# Patient Record
Sex: Male | Born: 1965 | Race: White | Hispanic: No | Smoking: Current every day smoker
Health system: Southern US, Community
[De-identification: ages and names within clinical notes are randomized; demographics above are authoritative.]

## PROBLEM LIST (undated history)

## (undated) DIAGNOSIS — K219 Gastro-esophageal reflux disease without esophagitis: Secondary | ICD-10-CM

## (undated) DIAGNOSIS — T7840XA Allergy, unspecified, initial encounter: Secondary | ICD-10-CM

## (undated) DIAGNOSIS — F419 Anxiety disorder, unspecified: Secondary | ICD-10-CM

## (undated) DIAGNOSIS — I10 Essential (primary) hypertension: Secondary | ICD-10-CM

## (undated) DIAGNOSIS — F32A Depression, unspecified: Secondary | ICD-10-CM

## (undated) DIAGNOSIS — G35 Multiple sclerosis: Secondary | ICD-10-CM

## (undated) HISTORY — DX: Gastro-esophageal reflux disease without esophagitis: K21.9

## (undated) HISTORY — PX: OTHER SURGICAL HISTORY: SHX169

## (undated) HISTORY — PX: CERVICAL FUSION: SHX112

## (undated) HISTORY — DX: Depression, unspecified: F32.A

## (undated) HISTORY — PX: APPENDECTOMY: SHX54

## (undated) HISTORY — DX: Essential (primary) hypertension: I10

## (undated) HISTORY — DX: Anxiety disorder, unspecified: F41.9

## (undated) HISTORY — DX: Multiple sclerosis: G35

## (undated) HISTORY — DX: Allergy, unspecified, initial encounter: T78.40XA

---

## 2020-03-09 ENCOUNTER — Ambulatory Visit: Payer: Medicare HMO | Admitting: Neurology

## 2020-03-18 ENCOUNTER — Ambulatory Visit: Payer: Medicare HMO | Admitting: Neurology

## 2020-03-19 ENCOUNTER — Ambulatory Visit: Payer: Medicare HMO | Admitting: Neurology

## 2020-03-19 ENCOUNTER — Encounter: Payer: Self-pay | Admitting: Neurology

## 2020-03-19 ENCOUNTER — Other Ambulatory Visit: Payer: Self-pay

## 2020-03-19 VITALS — BP 138/75 | HR 77 | Ht 69.0 in | Wt 216.0 lb

## 2020-03-19 DIAGNOSIS — R269 Unspecified abnormalities of gait and mobility: Secondary | ICD-10-CM

## 2020-03-19 DIAGNOSIS — S4992XA Unspecified injury of left shoulder and upper arm, initial encounter: Secondary | ICD-10-CM | POA: Insufficient documentation

## 2020-03-19 DIAGNOSIS — G35 Multiple sclerosis: Secondary | ICD-10-CM | POA: Diagnosis not present

## 2020-03-19 DIAGNOSIS — R252 Cramp and spasm: Secondary | ICD-10-CM

## 2020-03-19 DIAGNOSIS — S4992XS Unspecified injury of left shoulder and upper arm, sequela: Secondary | ICD-10-CM

## 2020-03-19 MED ORDER — CYCLOBENZAPRINE HCL 10 MG PO TABS: 10.0000 mg | ORAL_TABLET | Freq: Three times a day (TID) | ORAL | 5 refills | Status: DC | PRN

## 2020-03-19 MED ORDER — RISPERIDONE 0.25 MG PO TABS: 0.5000 mg | ORAL_TABLET | Freq: Every day | ORAL | 11 refills | Status: DC

## 2020-03-19 MED ORDER — GABAPENTIN 400 MG PO CAPS: ORAL_CAPSULE | ORAL | 11 refills | Status: DC
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
## 2020-03-24 ENCOUNTER — Telehealth: Payer: Self-pay | Admitting: *Deleted

## 2020-03-24 NOTE — Telephone Encounter (Signed)
Request faxed to [facility] [phone].

## 2020-04-04 ENCOUNTER — Telehealth: Payer: Self-pay | Admitting: Neurology

## 2020-04-04 NOTE — Telephone Encounter (Signed)
We received notes from [facility].  MRI images are not yet available.  According to his neurologist, MRI scans over the years have not demonstrated any new lesions prompting the decision not to use a disease modifying therapy.  His second to last evaluatio was 05/24/2019.  Repeat MRI scans of the brain cervical and thoracic spine were unchanged compared to the MRI from 2017, by report.    There is no explanation on the recent MRIs to explain his left arm pain, muscle spasms and tremors that worsened in 2019.  EMG/NCV done in February 2021 was normal.  He saw Dr.Eleftheriou in 2020 and 2021. He previously had seen Dr. Melanee Left  MRI prior Drive reports were reviewed (images not available have been requested).   MRI of the brain 03/05/2019 showed an unchanged appearance of multiple foci of T2/flair hyperintensity compared to the 2017 MRI compatible with the patient's history of multiple sclerosis.  No active plaque or significant atrophy.  No new lesions were identified.    MRI of the cervical spine 03/05/2019 showed a normal appearance of the cervical spinal cord and mild stable degenerative changes that do not cause foraminal or canal narrowing  MRI of the thoracic spine 03/05/2019 showed no evidence of demyelinating disease affecting the thoracic spinal cord and no significant degenerative change

## 2020-06-29 DIAGNOSIS — I10 Essential (primary) hypertension: Secondary | ICD-10-CM | POA: Diagnosis not present

## 2020-06-29 DIAGNOSIS — G35 Multiple sclerosis: Secondary | ICD-10-CM | POA: Diagnosis not present

## 2020-06-29 DIAGNOSIS — Z1211 Encounter for screening for malignant neoplasm of colon: Secondary | ICD-10-CM | POA: Diagnosis not present

## 2020-06-30 DIAGNOSIS — F4312 Post-traumatic stress disorder, chronic: Secondary | ICD-10-CM | POA: Diagnosis not present

## 2020-06-30 DIAGNOSIS — F331 Major depressive disorder, recurrent, moderate: Secondary | ICD-10-CM | POA: Diagnosis not present

## 2020-06-30 DIAGNOSIS — F411 Generalized anxiety disorder: Secondary | ICD-10-CM | POA: Diagnosis not present

## 2020-07-14 DIAGNOSIS — Z1211 Encounter for screening for malignant neoplasm of colon: Secondary | ICD-10-CM | POA: Diagnosis not present

## 2020-07-14 DIAGNOSIS — K635 Polyp of colon: Secondary | ICD-10-CM | POA: Diagnosis not present

## 2020-07-14 DIAGNOSIS — K6389 Other specified diseases of intestine: Secondary | ICD-10-CM | POA: Diagnosis not present

## 2020-07-17 DIAGNOSIS — F329 Major depressive disorder, single episode, unspecified: Secondary | ICD-10-CM | POA: Diagnosis not present

## 2020-07-17 DIAGNOSIS — Z888 Allergy status to other drugs, medicaments and biological substances status: Secondary | ICD-10-CM | POA: Diagnosis not present

## 2020-07-17 DIAGNOSIS — F411 Generalized anxiety disorder: Secondary | ICD-10-CM | POA: Diagnosis not present

## 2020-07-17 DIAGNOSIS — G8929 Other chronic pain: Secondary | ICD-10-CM | POA: Diagnosis not present

## 2020-07-17 DIAGNOSIS — G629 Polyneuropathy, unspecified: Secondary | ICD-10-CM | POA: Diagnosis not present

## 2020-07-17 DIAGNOSIS — E669 Obesity, unspecified: Secondary | ICD-10-CM | POA: Diagnosis not present

## 2020-07-17 DIAGNOSIS — G35 Multiple sclerosis: Secondary | ICD-10-CM | POA: Diagnosis not present

## 2020-07-17 DIAGNOSIS — G8220 Paraplegia, unspecified: Secondary | ICD-10-CM | POA: Diagnosis not present

## 2020-07-17 DIAGNOSIS — F4310 Post-traumatic stress disorder, unspecified: Secondary | ICD-10-CM | POA: Diagnosis not present

## 2020-07-17 DIAGNOSIS — F17210 Nicotine dependence, cigarettes, uncomplicated: Secondary | ICD-10-CM | POA: Diagnosis not present

## 2020-07-17 DIAGNOSIS — Z8249 Family history of ischemic heart disease and other diseases of the circulatory system: Secondary | ICD-10-CM | POA: Diagnosis not present

## 2020-07-17 DIAGNOSIS — N529 Male erectile dysfunction, unspecified: Secondary | ICD-10-CM | POA: Diagnosis not present

## 2020-07-23 ENCOUNTER — Ambulatory Visit: Payer: Medicare HMO | Admitting: Neurology

## 2020-08-03 ENCOUNTER — Ambulatory Visit: Payer: Medicare HMO | Admitting: Neurology

## 2020-08-03 ENCOUNTER — Encounter: Payer: Self-pay | Admitting: Neurology

## 2020-08-03 VITALS — BP 148/91 | HR 65 | Ht 69.0 in | Wt 210.0 lb

## 2020-08-03 DIAGNOSIS — Z79899 Other long term (current) drug therapy: Secondary | ICD-10-CM | POA: Diagnosis not present

## 2020-08-03 DIAGNOSIS — G35 Multiple sclerosis: Secondary | ICD-10-CM

## 2020-08-03 DIAGNOSIS — R252 Cramp and spasm: Secondary | ICD-10-CM

## 2020-08-03 DIAGNOSIS — R269 Unspecified abnormalities of gait and mobility: Secondary | ICD-10-CM
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
## 2020-08-05 LAB — COMPREHENSIVE METABOLIC PANEL
ALT: 19 IU/L (ref 0–44)
AST: 11 IU/L (ref 0–40)
Albumin/Globulin Ratio: 2.3 — ABNORMAL HIGH (ref 1.2–2.2)
Albumin: 5.1 g/dL — ABNORMAL HIGH (ref 3.8–4.9)
Alkaline Phosphatase: 79 IU/L (ref 44–121)
BUN/Creatinine Ratio: 17 (ref 9–20)
BUN: 16 mg/dL (ref 6–24)
Bilirubin Total: 0.4 mg/dL (ref 0.0–1.2)
CO2: 25 mmol/L (ref 20–29)
Calcium: 9.6 mg/dL (ref 8.7–10.2)
Chloride: 103 mmol/L (ref 96–106)
Creatinine, Ser: 0.95 mg/dL (ref 0.76–1.27)
Globulin, Total: 2.2 g/dL (ref 1.5–4.5)
Glucose: 93 mg/dL (ref 65–99)
Potassium: 4.5 mmol/L (ref 3.5–5.2)
Sodium: 140 mmol/L (ref 134–144)
Total Protein: 7.3 g/dL (ref 6.0–8.5)
eGFR: 95 mL/min/{1.73_m2} (ref >59)

## 2020-08-05 LAB — QUANTIFERON-TB GOLD PLUS
QuantiFERON Mitogen Value: >10 IU/mL
QuantiFERON Nil Value: 0 IU/mL
QuantiFERON TB1 Ag Value: 0.06 IU/mL
QuantiFERON TB2 Ag Value: 0 IU/mL
QuantiFERON-TB Gold Plus: NEGATIVE

## 2020-08-05 LAB — CBC WITH DIFFERENTIAL/PLATELET
Basophils Absolute: 0.1 10*3/uL (ref 0.0–0.2)
Basos: 1 %
EOS (ABSOLUTE): 0.2 10*3/uL (ref 0.0–0.4)
Eos: 2 %
Hematocrit: 50.1 % (ref 37.5–51.0)
Hemoglobin: 17 g/dL (ref 13.0–17.7)
Immature Grans (Abs): 0 10*3/uL (ref 0.0–0.1)
Immature Granulocytes: 0 %
Lymphocytes Absolute: 2.3 10*3/uL (ref 0.7–3.1)
Lymphs: 27 %
MCH: 31.2 pg (ref 26.6–33.0)
MCHC: 33.9 g/dL (ref 31.5–35.7)
MCV: 92 fL (ref 79–97)
Monocytes Absolute: 0.8 10*3/uL (ref 0.1–0.9)
Monocytes: 9 %
Neutrophils Absolute: 5.2 10*3/uL (ref 1.4–7.0)
Neutrophils: 61 %
Platelets: 264 10*3/uL (ref 150–450)
RBC: 5.45 x10E6/uL (ref 4.14–5.80)
RDW: 11.8 % (ref 11.6–15.4)
WBC: 8.5 10*3/uL (ref 3.4–10.8)

## 2020-08-10 ENCOUNTER — Telehealth: Payer: Self-pay | Admitting: *Deleted

## 2020-08-10 NOTE — Telephone Encounter (Signed)
R/c cd [employer] cd on [employer].

## 2020-08-17 ENCOUNTER — Telehealth: Payer: Self-pay | Admitting: Neurology

## 2020-08-17 NOTE — Telephone Encounter (Signed)
Called, got VM and left message   I compared the MRIs from 03/05/2019 with MRIs from 04/10/2015 and 04/09/2014.  The MRI of the brain shows an overall very low plaque burden with some periventricular and deep white matter foci in the hemispheres but not in the infratentorial white matter.  The MRI of the thoracic spine from January 2021 showed several foci at T10, T11 and T11-T12 and possibly another focus at T4.    The MRI of the brain shows mild progression between 2017 and 2021 with 1-2 more foci.  The foci at T11 and T12 seen in 2021 were not clearly seen on thoracic MRI from 2016 or 2017.  There were no cervical spine plaques.  He has ACDF from C5-C7.

## 2020-08-18 DIAGNOSIS — E6609 Other obesity due to excess calories: Secondary | ICD-10-CM | POA: Diagnosis not present

## 2020-08-18 DIAGNOSIS — D751 Secondary polycythemia: Secondary | ICD-10-CM | POA: Diagnosis not present

## 2020-08-18 DIAGNOSIS — M25512 Pain in left shoulder: Secondary | ICD-10-CM | POA: Diagnosis not present

## 2020-08-18 DIAGNOSIS — I10 Essential (primary) hypertension: Secondary | ICD-10-CM | POA: Diagnosis not present

## 2020-08-18 DIAGNOSIS — Z720 Tobacco use: Secondary | ICD-10-CM | POA: Diagnosis not present

## 2020-08-18 DIAGNOSIS — G8929 Other chronic pain: Secondary | ICD-10-CM | POA: Diagnosis not present

## 2020-08-18 DIAGNOSIS — Z0001 Encounter for general adult medical examination with abnormal findings: Secondary | ICD-10-CM | POA: Diagnosis not present

## 2020-08-20 DIAGNOSIS — H5203 Hypermetropia, bilateral: Secondary | ICD-10-CM | POA: Diagnosis not present

## 2020-08-24 DIAGNOSIS — M25512 Pain in left shoulder: Secondary | ICD-10-CM | POA: Diagnosis not present

## 2020-08-25 DIAGNOSIS — F331 Major depressive disorder, recurrent, moderate: Secondary | ICD-10-CM | POA: Diagnosis not present

## 2020-08-25 DIAGNOSIS — F411 Generalized anxiety disorder: Secondary | ICD-10-CM | POA: Diagnosis not present

## 2020-08-25 DIAGNOSIS — F4312 Post-traumatic stress disorder, chronic: Secondary | ICD-10-CM | POA: Diagnosis not present

## 2020-09-04 DIAGNOSIS — M25512 Pain in left shoulder: Secondary | ICD-10-CM | POA: Diagnosis not present

## 2020-09-07 NOTE — Telephone Encounter (Signed)
Weeks Mr. Touhyabout the MRI results and comparison.  I would like to get him started on Aubagio.  I think he signed a service request form.  If not we will need to have him do so.  He would need monthly liver function tests for 6 months once he gets started

## 2020-09-08 NOTE — Telephone Encounter (Signed)
Start form for review is at the front desk for patient.

## 2020-09-08 NOTE — Telephone Encounter (Signed)
I called patient.  He needs to sign and Aubagio start form.  He is agreeable to this.  He will be coming by tomorrow afternoon to sign the Aubagio start form.  I discussed the process including insurance authorization and communication with [facility] and the specialty pharmacy.  I reminded him that he will need monthly liver function test for 6 months after starting Aubagio.  Patient is agreeable to all of this.

## 2020-09-09 DIAGNOSIS — M25512 Pain in left shoulder: Secondary | ICD-10-CM | POA: Diagnosis not present

## 2020-09-14 NOTE — Telephone Encounter (Signed)
PA for aubagio was denied. "The drug you asked for is non-formulary (not on [employer] list of preferred drugs). Before the drug can be covered, we need more information. Please ask your prescriber to explain to [facility] why the preferred drugs have not worked for your medical condition and/or would have bad side effects. If you have not tried the preferred drugs, including Betaseron subcutaneous kit, Copaxone subcutaneous syringe, dimethyl fumarate capsule delayed release, Gilenya capsule, glatiramer subcutaneous syringe, Glatopa subcutaneous syringe, and Kesimpta Pen subcutaneous pen injector, please talk to your health care provider about prescribing one of these for you. Some preferred drugs may require an additional review and approval by [facility]. Additionally, some alternative drugs listed may be the same drugs with different strengths or forms. Under certain cases, [employer] may only require trial and failure of one strength or form of that drug. This determination was based on the [employer] Non-Formulary Exceptions Coverage Policy."

## 2020-09-14 NOTE — Telephone Encounter (Signed)
Dr. Epimenio Foot signed the Aubagio start form.  I have faxed it to [employer].  PA initiated on CMM. Sent to [facility].Key: [id number]. Should have determination within 1-3 business days.

## 2020-09-15 NOTE — Telephone Encounter (Signed)
Discussed with Dr. Epimenio Foot. Appeal letter written and sent to [facility] via CMM.

## 2020-09-16 ENCOUNTER — Telehealth: Payer: Self-pay | Admitting: Neurology

## 2020-09-16 NOTE — Telephone Encounter (Signed)
Pt called stating he got a phone call from [facility] pharmacy stating that a prescription for his MS was denied. Pt did not know the name of the medication. He states he came in on last Thursday and signed a form. Pt requesting a call back.

## 2020-09-16 NOTE — Telephone Encounter (Signed)
Called the patient back.  Advised the patient that we have been notified of the denial.  Informed the patient that our team is working on the appeal letter for him to get that medication.  Advised it can take up to 30 days before receiving a response on the appeal letter.  Advised once we have that information we will let him know.  Patient verbalized understanding.

## 2020-09-21 NOTE — Telephone Encounter (Signed)
I called [employer] to check the status of the appeal. [employer]'s system is down and I was asked to call back later.

## 2020-09-21 NOTE — Telephone Encounter (Signed)
I called [employer].  They have not received the appeal.  They will fax me the PA denial letter so that I can fax the urgent appeal letter for Aubagio.  Received denial letter for Aubagio.  Faxed urgent appeal to [facility].  Received a receipt of confirmation.  Turnaround time should be 72 hours or less.

## 2020-09-24 ENCOUNTER — Other Ambulatory Visit: Payer: Self-pay | Admitting: Neurology

## 2020-09-28 NOTE — Telephone Encounter (Signed)
Angelique is out of the office.  I called [employer].  I spoke with Toniann Fail, RN.  I advised her that the Aubagio appeal was approved.  She reports that they have been unable to reach patient.  He has not returned their phone calls.  Patient should call [phone] to schedule his delivery of Aubagio.  I called patient to discuss.  No answer, left a voicemail asking him to call me back.  If patient calls back please advise him that he will need to call the above phone number to schedule his delivery of Aubagio.

## 2020-09-28 NOTE — Telephone Encounter (Signed)
Pt called, informed patient of previous telephone note to call phone number to schedule delivery of Aubagio. Pt verbalized understand.

## 2020-09-28 NOTE — Telephone Encounter (Signed)
I called [employer] appeals department.  The appeal for Seth Lindsey was approved from September 22, 2020 to February 27, 2021.  It is good for quantity of 30 for 30 days.  The case ID number is [id number].  I have informed Angelique at [employer] services.

## 2020-10-05 DIAGNOSIS — F411 Generalized anxiety disorder: Secondary | ICD-10-CM | POA: Diagnosis not present

## 2020-10-05 DIAGNOSIS — F4312 Post-traumatic stress disorder, chronic: Secondary | ICD-10-CM | POA: Diagnosis not present

## 2020-10-05 DIAGNOSIS — F331 Major depressive disorder, recurrent, moderate: Secondary | ICD-10-CM | POA: Diagnosis not present

## 2020-10-12 NOTE — Telephone Encounter (Signed)
I called patient.  He received his Aubagio shipment.  He plans on starting it tomorrow.  I will check with him next week to make sure he tolerated well.  We also need to schedule his once monthly lab draws for the next 6 months.  Patient verbalized understanding.

## 2020-10-16 ENCOUNTER — Ambulatory Visit
Admission: RE | Admit: 2020-10-16 | Discharge: 2020-10-16 | Disposition: A | Discharge status: Home / Self Care | Payer: Medicare HMO | Source: Ambulatory Visit | Attending: Orthopaedic Surgery | Admitting: Orthopaedic Surgery

## 2020-10-16 ENCOUNTER — Other Ambulatory Visit: Payer: Self-pay | Admitting: Orthopaedic Surgery

## 2020-10-16 DIAGNOSIS — M19012 Primary osteoarthritis, left shoulder: Secondary | ICD-10-CM | POA: Diagnosis not present

## 2020-10-16 DIAGNOSIS — Z01818 Encounter for other preprocedural examination: Secondary | ICD-10-CM | POA: Diagnosis not present

## 2020-10-16 DIAGNOSIS — M25512 Pain in left shoulder: Secondary | ICD-10-CM

## 2020-10-19 ENCOUNTER — Other Ambulatory Visit: Payer: Self-pay

## 2020-10-19 DIAGNOSIS — Z79899 Other long term (current) drug therapy: Secondary | ICD-10-CM

## 2020-10-19 DIAGNOSIS — G35 Multiple sclerosis: Secondary | ICD-10-CM

## 2020-10-19 NOTE — Telephone Encounter (Signed)
Patient returned my call.  He reports that he has not started Aubagio yet.  He will be traveling to [place] in the coming days for family emergency.  He will start Aubagio before he leaves.  He will let us know if he has any side effects.  He considered delaying starting Aubagio until he returns.  I advised him that there is always a risk of further progression of MS the longer he is off a DMT. Patient decided that he will start before his trip.  Patient will come by late September for his first LFTs.  Patient had no further questions or concerns at this time.

## 2020-10-19 NOTE — Telephone Encounter (Signed)
I called patient to discuss. No answer, left a voicemail asking him to call me back. 

## 2020-11-09 ENCOUNTER — Other Ambulatory Visit: Payer: Self-pay

## 2020-11-09 ENCOUNTER — Encounter (HOSPITAL_BASED_OUTPATIENT_CLINIC_OR_DEPARTMENT_OTHER): Payer: Self-pay | Admitting: Orthopaedic Surgery

## 2020-11-11 ENCOUNTER — Encounter (HOSPITAL_BASED_OUTPATIENT_CLINIC_OR_DEPARTMENT_OTHER)
Admission: RE | Admit: 2020-11-11 | Discharge: 2020-11-11 | Disposition: A | Discharge status: Home / Self Care | Payer: Medicare HMO | Source: Ambulatory Visit | Attending: Orthopaedic Surgery | Admitting: Orthopaedic Surgery

## 2020-11-11 DIAGNOSIS — Z01810 Encounter for preprocedural cardiovascular examination: Secondary | ICD-10-CM | POA: Diagnosis not present

## 2020-11-11 NOTE — Progress Notes (Signed)
Reviewed EKG with Dr. Hodierne. Okay to proceed with surgery as scheduled  

## 2020-11-11 NOTE — Progress Notes (Addendum)
Gave patient BPO and instructions     Enhanced Recovery after Surgery for Orthopedics Enhanced Recovery after Surgery is a protocol used to improve the stress on your body and your recovery after surgery.  Patient Instructions  The night before surgery:  No food after midnight. ONLY clear liquids after midnight  The day of surgery (if you do NOT have diabetes):  Drink ONE (1) Pre-Surgery Clear Ensure as directed.   This drink was given to you during your hospital  pre-op appointment visit. The pre-op nurse will instruct you on the time to drink the  Pre-Surgery Ensure depending on your surgery time. Finish the drink at the designated time by the pre-op nurse.  Nothing else to drink after completing the  Pre-Surgery Clear Ensure.  The day of surgery (if you have diabetes): Drink ONE (1) Gatorade 2 (G2) as directed. This drink was given to you during your hospital  pre-op appointment visit.  The pre-op nurse will instruct you on the time to drink the   Gatorade 2 (G2) depending on your surgery time. Color of the Gatorade may vary. Red is not allowed. Nothing else to drink after completing the  Gatorade 2 (G2).         If you have questions, please contact your surgeon's office.

## 2020-11-13 DIAGNOSIS — D751 Secondary polycythemia: Secondary | ICD-10-CM | POA: Diagnosis not present

## 2020-11-13 DIAGNOSIS — G8929 Other chronic pain: Secondary | ICD-10-CM | POA: Diagnosis not present

## 2020-11-13 DIAGNOSIS — I10 Essential (primary) hypertension: Secondary | ICD-10-CM | POA: Diagnosis not present

## 2020-11-13 DIAGNOSIS — Z720 Tobacco use: Secondary | ICD-10-CM | POA: Diagnosis not present

## 2020-11-13 DIAGNOSIS — Z01818 Encounter for other preprocedural examination: Secondary | ICD-10-CM | POA: Diagnosis not present

## 2020-11-13 DIAGNOSIS — Z131 Encounter for screening for diabetes mellitus: Secondary | ICD-10-CM | POA: Diagnosis not present

## 2020-11-13 DIAGNOSIS — M25512 Pain in left shoulder: Secondary | ICD-10-CM | POA: Diagnosis not present

## 2020-11-13 DIAGNOSIS — R739 Hyperglycemia, unspecified: Secondary | ICD-10-CM | POA: Diagnosis not present

## 2020-11-13 DIAGNOSIS — E6609 Other obesity due to excess calories: Secondary | ICD-10-CM | POA: Diagnosis not present
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
## 2020-11-17 NOTE — Anesthesia Preprocedure Evaluation (Addendum)
Anesthesia Evaluation  Patient identified by MRN, date of birth, ID band Patient awake    Reviewed: Allergy & Precautions, H&P , NPO status , Patient's Chart, lab work & pertinent test results  Airway Mallampati: II  TM Distance: >3 FB Neck ROM: Full    Dental no notable dental hx. (+) Teeth Intact, Dental Advisory Given   Pulmonary neg pulmonary ROS, Current Smoker and Patient abstained from smoking.,    Pulmonary exam normal breath sounds clear to auscultation       Cardiovascular Exercise Tolerance: Good hypertension, Pt. on medications negative cardio ROS Normal cardiovascular exam Rhythm:Regular Rate:Normal     Neuro/Psych Multiple sclerosis   Neuromuscular disease negative neurological ROS  negative psych ROS   GI/Hepatic negative GI ROS, Neg liver ROS,   Endo/Other  negative endocrine ROS  Renal/GU negative Renal ROS  negative genitourinary   Musculoskeletal negative musculoskeletal ROS (+)   Abdominal   Peds negative pediatric ROS (+)  Hematology negative hematology ROS (+)   Anesthesia Other Findings MS with some weakness in LE.  Motor in UE is normal with some hand paraesthesia.   Reproductive/Obstetrics negative OB ROS                            Anesthesia Physical Anesthesia Plan  ASA: 3  Anesthesia Plan: General   Post-op Pain Management: GA combined w/ Regional for post-op pain   Induction: Intravenous  PONV Risk Score and Plan: 2 and Ondansetron and Dexamethasone  Airway Management Planned: Oral ETT and LMA  Additional Equipment: None  Intra-op Plan:   Post-operative Plan: Extubation in OR  Informed Consent: I have reviewed the patients History and Physical, chart, labs and discussed the procedure including the risks, benefits and alternatives for the proposed anesthesia with the patient or authorized representative who has indicated his/her understanding and  acceptance.     Dental advisory given  Plan Discussed with: Anesthesiologist and CRNA  Anesthesia Plan Comments: (Discussed both nerve block for pain relief post-op and GA; including NV, sore throat, dental injury, and pulmonary complications)       Anesthesia Quick Evaluation

## 2020-11-18 ENCOUNTER — Encounter (HOSPITAL_BASED_OUTPATIENT_CLINIC_OR_DEPARTMENT_OTHER)
Admission: RE | Disposition: A | Discharge status: Home / Self Care | Payer: Self-pay | Source: Home / Self Care | Attending: Orthopaedic Surgery

## 2020-11-18 ENCOUNTER — Other Ambulatory Visit: Payer: Self-pay

## 2020-11-18 ENCOUNTER — Ambulatory Visit (HOSPITAL_BASED_OUTPATIENT_CLINIC_OR_DEPARTMENT_OTHER)
Admission: RE | Admit: 2020-11-18 | Discharge: 2020-11-18 | Disposition: A | Discharge status: Home / Self Care | Payer: Medicare HMO | Attending: Orthopaedic Surgery | Admitting: Orthopaedic Surgery

## 2020-11-18 ENCOUNTER — Encounter (HOSPITAL_BASED_OUTPATIENT_CLINIC_OR_DEPARTMENT_OTHER): Payer: Self-pay | Admitting: Orthopaedic Surgery

## 2020-11-18 ENCOUNTER — Ambulatory Visit (HOSPITAL_BASED_OUTPATIENT_CLINIC_OR_DEPARTMENT_OTHER): Payer: Medicare HMO | Admitting: Anesthesiology

## 2020-11-18 ENCOUNTER — Ambulatory Visit (HOSPITAL_COMMUNITY): Payer: Medicare HMO

## 2020-11-18 DIAGNOSIS — S42292P Other displaced fracture of upper end of left humerus, subsequent encounter for fracture with malunion: Secondary | ICD-10-CM | POA: Diagnosis not present

## 2020-11-18 DIAGNOSIS — G35 Multiple sclerosis: Secondary | ICD-10-CM | POA: Diagnosis not present

## 2020-11-18 DIAGNOSIS — Z471 Aftercare following joint replacement surgery: Secondary | ICD-10-CM | POA: Diagnosis not present

## 2020-11-18 DIAGNOSIS — F17210 Nicotine dependence, cigarettes, uncomplicated: Secondary | ICD-10-CM | POA: Diagnosis not present

## 2020-11-18 DIAGNOSIS — I10 Essential (primary) hypertension: Secondary | ICD-10-CM | POA: Insufficient documentation

## 2020-11-18 DIAGNOSIS — M7522 Bicipital tendinitis, left shoulder: Secondary | ICD-10-CM | POA: Diagnosis not present

## 2020-11-18 DIAGNOSIS — Z09 Encounter for follow-up examination after completed treatment for conditions other than malignant neoplasm: Secondary | ICD-10-CM

## 2020-11-18 DIAGNOSIS — X58XXXD Exposure to other specified factors, subsequent encounter: Secondary | ICD-10-CM | POA: Insufficient documentation

## 2020-11-18 DIAGNOSIS — M19012 Primary osteoarthritis, left shoulder: Secondary | ICD-10-CM | POA: Diagnosis not present

## 2020-11-18 DIAGNOSIS — Z887 Allergy status to serum and vaccine status: Secondary | ICD-10-CM | POA: Insufficient documentation

## 2020-11-18 DIAGNOSIS — Z79899 Other long term (current) drug therapy: Secondary | ICD-10-CM | POA: Insufficient documentation

## 2020-11-18 DIAGNOSIS — Z9889 Other specified postprocedural states: Secondary | ICD-10-CM | POA: Diagnosis not present

## 2020-11-18 DIAGNOSIS — S42302P Unspecified fracture of shaft of humerus, left arm, subsequent encounter for fracture with malunion: Secondary | ICD-10-CM | POA: Diagnosis not present

## 2020-11-18 DIAGNOSIS — G8918 Other acute postprocedural pain: Secondary | ICD-10-CM | POA: Diagnosis not present

## 2020-11-18 DIAGNOSIS — Z96612 Presence of left artificial shoulder joint: Secondary | ICD-10-CM | POA: Diagnosis not present

## 2020-11-18 DIAGNOSIS — Z888 Allergy status to other drugs, medicaments and biological substances status: Secondary | ICD-10-CM | POA: Diagnosis not present

## 2020-11-18 HISTORY — PX: REVERSE SHOULDER ARTHROPLASTY: SHX5054

## 2020-11-18 SURGERY — ARTHROPLASTY, SHOULDER, TOTAL, REVERSE
Anesthesia: General | Site: Shoulder | Laterality: Left

## 2020-11-18 MED ORDER — CELECOXIB 100 MG PO CAPS: 100.0000 mg | ORAL_CAPSULE | Freq: Two times a day (BID) | ORAL | 0 refills | Status: AC

## 2020-11-18 MED ORDER — ASPIRIN 81 MG PO CHEW: 81.0000 mg | CHEWABLE_TABLET | Freq: Two times a day (BID) | ORAL | 0 refills | Status: AC

## 2020-11-18 MED ORDER — ROCURONIUM BROMIDE 10 MG/ML (PF) SYRINGE
PREFILLED_SYRINGE | INTRAVENOUS | Status: AC
  Filled 2020-11-18: qty 10

## 2020-11-18 MED ORDER — MIDAZOLAM HCL 2 MG/2ML IJ SOLN
INTRAMUSCULAR | Status: AC
  Filled 2020-11-18: qty 2

## 2020-11-18 MED ORDER — FENTANYL CITRATE (PF) 100 MCG/2ML IJ SOLN
INTRAMUSCULAR | Status: AC
  Filled 2020-11-18: qty 2

## 2020-11-18 MED ORDER — GABAPENTIN 300 MG PO CAPS
ORAL_CAPSULE | ORAL | Status: AC
  Filled 2020-11-18: qty 1

## 2020-11-18 MED ORDER — PHENYLEPHRINE HCL (PRESSORS) 10 MG/ML IV SOLN
INTRAVENOUS | Status: DC | PRN
  Administered 2020-11-18: 40 ug via INTRAVENOUS
  Administered 2020-11-18: 120 ug via INTRAVENOUS
  Administered 2020-11-18: 80 ug via INTRAVENOUS
  Administered 2020-11-18: 40 ug via INTRAVENOUS
  Administered 2020-11-18: 80 ug via INTRAVENOUS
  Administered 2020-11-18: 40 ug via INTRAVENOUS

## 2020-11-18 MED ORDER — FENTANYL CITRATE (PF) 100 MCG/2ML IJ SOLN
INTRAMUSCULAR | Status: DC | PRN
  Administered 2020-11-18: 100 ug via INTRAVENOUS

## 2020-11-18 MED ORDER — VANCOMYCIN HCL 1000 MG IV SOLR
INTRAVENOUS | Status: DC | PRN
  Administered 2020-11-18: 1000 mg

## 2020-11-18 MED ORDER — CEFAZOLIN SODIUM-DEXTROSE 2-4 GM/100ML-% IV SOLN
INTRAVENOUS | Status: AC
  Filled 2020-11-18: qty 100

## 2020-11-18 MED ORDER — SUGAMMADEX SODIUM 200 MG/2ML IV SOLN
INTRAVENOUS | Status: DC | PRN
  Administered 2020-11-18: 200 mg via INTRAVENOUS

## 2020-11-18 MED ORDER — ACETAMINOPHEN 500 MG PO TABS
1000.0000 mg | ORAL_TABLET | Freq: Once | ORAL | Status: AC
  Administered 2020-11-18: 1000 mg via ORAL

## 2020-11-18 MED ORDER — FENTANYL CITRATE (PF) 100 MCG/2ML IJ SOLN: 25.0000 ug | INTRAMUSCULAR | Status: DC | PRN

## 2020-11-18 MED ORDER — VASOPRESSIN 20 UNIT/ML IV SOLN
INTRAVENOUS | Status: DC | PRN
  Administered 2020-11-18: 1 [IU] via INTRAVENOUS
  Administered 2020-11-18: 2 [IU] via INTRAVENOUS
  Administered 2020-11-18 (×3): 1 [IU] via INTRAVENOUS
  Administered 2020-11-18: 2 [IU] via INTRAVENOUS

## 2020-11-18 MED ORDER — ONDANSETRON HCL 4 MG/2ML IJ SOLN
INTRAMUSCULAR | Status: DC | PRN
  Administered 2020-11-18: 4 mg via INTRAVENOUS

## 2020-11-18 MED ORDER — GABAPENTIN 600 MG PO TABS: 600.0000 mg | ORAL_TABLET | Freq: Three times a day (TID) | ORAL | 0 refills | Status: DC

## 2020-11-18 MED ORDER — ACETAMINOPHEN 160 MG/5ML PO SOLN: 325.0000 mg | ORAL | Status: DC | PRN

## 2020-11-18 MED ORDER — PHENYLEPHRINE HCL (PRESSORS) 10 MG/ML IV SOLN
INTRAVENOUS | Status: AC
  Filled 2020-11-18: qty 1

## 2020-11-18 MED ORDER — OXYCODONE HCL 5 MG/5ML PO SOLN: 5.0000 mg | Freq: Once | ORAL | Status: AC | PRN

## 2020-11-18 MED ORDER — LIDOCAINE 2% (20 MG/ML) 5 ML SYRINGE
INTRAMUSCULAR | Status: DC | PRN
  Administered 2020-11-18: 40 mg via INTRAVENOUS

## 2020-11-18 MED ORDER — FENTANYL CITRATE (PF) 100 MCG/2ML IJ SOLN
100.0000 ug | Freq: Once | INTRAMUSCULAR | Status: AC
  Administered 2020-11-18: 100 ug via INTRAVENOUS

## 2020-11-18 MED ORDER — TRANEXAMIC ACID-NACL 1000-0.7 MG/100ML-% IV SOLN
INTRAVENOUS | Status: AC
  Filled 2020-11-18: qty 100

## 2020-11-18 MED ORDER — TRANEXAMIC ACID-NACL 1000-0.7 MG/100ML-% IV SOLN
1000.0000 mg | INTRAVENOUS | Status: AC
  Administered 2020-11-18: 1000 mg via INTRAVENOUS

## 2020-11-18 MED ORDER — ACETAMINOPHEN 325 MG PO TABS: 325.0000 mg | ORAL_TABLET | ORAL | Status: DC | PRN

## 2020-11-18 MED ORDER — OMEPRAZOLE 20 MG PO CPDR: 20.0000 mg | DELAYED_RELEASE_CAPSULE | Freq: Every day | ORAL | 0 refills | Status: DC

## 2020-11-18 MED ORDER — BUPIVACAINE LIPOSOME 1.3 % IJ SUSP
INTRAMUSCULAR | Status: DC | PRN
  Administered 2020-11-18: 10 mL via PERINEURAL

## 2020-11-18 MED ORDER — BUPIVACAINE HCL (PF) 0.25 % IJ SOLN
INTRAMUSCULAR | Status: AC
  Filled 2020-11-18: qty 30

## 2020-11-18 MED ORDER — PROPOFOL 10 MG/ML IV BOLUS
INTRAVENOUS | Status: DC | PRN
  Administered 2020-11-18: 200 mg via INTRAVENOUS

## 2020-11-18 MED ORDER — ROCURONIUM BROMIDE 100 MG/10ML IV SOLN
INTRAVENOUS | Status: DC | PRN
  Administered 2020-11-18: 60 mg via INTRAVENOUS

## 2020-11-18 MED ORDER — LACTATED RINGERS IV SOLN: INTRAVENOUS | Status: DC

## 2020-11-18 MED ORDER — OXYCODONE HCL 5 MG PO TABS: ORAL_TABLET | ORAL | 0 refills | Status: AC

## 2020-11-18 MED ORDER — PROPOFOL 10 MG/ML IV BOLUS
INTRAVENOUS | Status: AC
  Filled 2020-11-18: qty 40

## 2020-11-18 MED ORDER — ACETAMINOPHEN 500 MG PO TABS: 1000.0000 mg | ORAL_TABLET | Freq: Three times a day (TID) | ORAL | 0 refills | Status: AC

## 2020-11-18 MED ORDER — ACETAMINOPHEN 500 MG PO TABS
ORAL_TABLET | ORAL | Status: AC
  Filled 2020-11-18: qty 2

## 2020-11-18 MED ORDER — ONDANSETRON HCL 4 MG/2ML IJ SOLN: 4.0000 mg | Freq: Once | INTRAMUSCULAR | Status: DC | PRN

## 2020-11-18 MED ORDER — OXYCODONE HCL 5 MG PO TABS
5.0000 mg | ORAL_TABLET | Freq: Once | ORAL | Status: AC | PRN
  Administered 2020-11-18: 5 mg via ORAL

## 2020-11-18 MED ORDER — CEFAZOLIN SODIUM-DEXTROSE 2-4 GM/100ML-% IV SOLN
2.0000 g | INTRAVENOUS | Status: AC
  Administered 2020-11-18: 2 g via INTRAVENOUS

## 2020-11-18 MED ORDER — ATROPINE SULFATE 0.4 MG/ML IJ SOLN
INTRAMUSCULAR | Status: AC
  Filled 2020-11-18: qty 1

## 2020-11-18 MED ORDER — MEPERIDINE HCL 25 MG/ML IJ SOLN: 6.2500 mg | INTRAMUSCULAR | Status: DC | PRN

## 2020-11-18 MED ORDER — LIDOCAINE HCL (PF) 2 % IJ SOLN
INTRAMUSCULAR | Status: AC
  Filled 2020-11-18: qty 5

## 2020-11-18 MED ORDER — OXYCODONE HCL 5 MG PO TABS
ORAL_TABLET | ORAL | Status: AC
  Filled 2020-11-18: qty 1

## 2020-11-18 MED ORDER — LACTATED RINGERS IV BOLUS: 250.0000 mL | Freq: Once | INTRAVENOUS | Status: DC

## 2020-11-18 MED ORDER — ONDANSETRON HCL 4 MG/2ML IJ SOLN
INTRAMUSCULAR | Status: AC
  Filled 2020-11-18: qty 2

## 2020-11-18 MED ORDER — GABAPENTIN 300 MG PO CAPS
300.0000 mg | ORAL_CAPSULE | Freq: Once | ORAL | Status: AC
  Administered 2020-11-18: 300 mg via ORAL

## 2020-11-18 MED ORDER — ONDANSETRON HCL 4 MG PO TABS: 4.0000 mg | ORAL_TABLET | Freq: Three times a day (TID) | ORAL | 0 refills | Status: AC | PRN

## 2020-11-18 MED ORDER — EPHEDRINE SULFATE 50 MG/ML IJ SOLN
INTRAMUSCULAR | Status: DC | PRN
  Administered 2020-11-18: 5 mg via INTRAVENOUS
  Administered 2020-11-18 (×2): 10 mg via INTRAVENOUS
  Administered 2020-11-18: 5 mg via INTRAVENOUS
  Administered 2020-11-18: 10 mg via INTRAVENOUS

## 2020-11-18 MED ORDER — BUPIVACAINE-EPINEPHRINE (PF) 0.5% -1:200000 IJ SOLN
INTRAMUSCULAR | Status: DC | PRN
  Administered 2020-11-18: 15 mL via PERINEURAL

## 2020-11-18 MED ORDER — EPHEDRINE 5 MG/ML INJ
INTRAVENOUS | Status: AC
  Filled 2020-11-18: qty 10

## 2020-11-18 MED ORDER — DEXAMETHASONE SODIUM PHOSPHATE 10 MG/ML IJ SOLN
INTRAMUSCULAR | Status: AC
  Filled 2020-11-18: qty 1

## 2020-11-18 MED ORDER — DEXAMETHASONE SODIUM PHOSPHATE 10 MG/ML IJ SOLN
INTRAMUSCULAR | Status: DC | PRN
  Administered 2020-11-18: 5 mg via INTRAVENOUS

## 2020-11-18 MED ORDER — LACTATED RINGERS IV BOLUS
500.0000 mL | Freq: Once | INTRAVENOUS | Status: AC
  Administered 2020-11-18: 500 mL via INTRAVENOUS

## 2020-11-18 MED ORDER — VANCOMYCIN HCL 1000 MG IV SOLR
INTRAVENOUS | Status: AC
  Filled 2020-11-18: qty 60

## 2020-11-18 MED ORDER — MIDAZOLAM HCL 2 MG/2ML IJ SOLN
2.0000 mg | Freq: Once | INTRAMUSCULAR | Status: AC
  Administered 2020-11-18: 2 mg via INTRAVENOUS

## 2020-11-18 SURGICAL SUPPLY — 70 items
AID PSTN UNV HD RSTRNT DISP (MISCELLANEOUS) ×1
APL PRP STRL LF DISP 70% ISPRP (MISCELLANEOUS) ×1
BASEPLATE GLENOID STD REV 42 (Joint) ×2 IMPLANT
BASEPLATE GLENOSPHERE 25 STD (Miscellaneous) ×2 IMPLANT
BIT DRILL 3.2 PERIPHERAL SCREW (BIT) ×2 IMPLANT
BLADE HEX COATED 2.75 (ELECTRODE) IMPLANT
BLADE SAW SAG 73X25 THK (BLADE) ×1
BLADE SAW SGTL 73X25 THK (BLADE) ×1 IMPLANT
BLADE SURG 10 STRL SS (BLADE) IMPLANT
BLADE SURG 15 STRL LF DISP TIS (BLADE) IMPLANT
BLADE SURG 15 STRL SS (BLADE)
BNDG COHESIVE 4X5 TAN ST LF (GAUZE/BANDAGES/DRESSINGS) IMPLANT
BRUSH SCRUB EZ PLAIN DRY (MISCELLANEOUS) IMPLANT
BSPLAT GLND STD 25 RVRS SHLDR (Miscellaneous) ×1 IMPLANT
CHLORAPREP W/TINT 26 (MISCELLANEOUS) ×2 IMPLANT
CLSR STERI-STRIP ANTIMIC 1/2X4 (GAUZE/BANDAGES/DRESSINGS) ×2 IMPLANT
COOLER ICEMAN CLASSIC (MISCELLANEOUS) ×2 IMPLANT
COVER BACK TABLE 60X90IN (DRAPES) ×2 IMPLANT
COVER MAYO STAND STRL (DRAPES) ×2 IMPLANT
DECANTER SPIKE VIAL GLASS SM (MISCELLANEOUS) IMPLANT
DRAPE IMP U-DRAPE 54X76 (DRAPES) IMPLANT
DRAPE INCISE IOBAN 66X45 STRL (DRAPES) ×2 IMPLANT
DRAPE U-SHAPE 76X120 STRL (DRAPES) ×4 IMPLANT
DRSG AQUACEL AG ADV 3.5X 6 (GAUZE/BANDAGES/DRESSINGS) ×2 IMPLANT
ELECT BLADE 4.0 EZ CLEAN MEGAD (MISCELLANEOUS) ×2
ELECT REM PT RETURN 9FT ADLT (ELECTROSURGICAL) ×2
ELECTRODE BLDE 4.0 EZ CLN MEGD (MISCELLANEOUS) ×1 IMPLANT
ELECTRODE REM PT RTRN 9FT ADLT (ELECTROSURGICAL) ×1 IMPLANT
FACESHIELD WRAPAROUND (MASK) ×2 IMPLANT
GLOVE SRG 8 PF TXTR STRL LF DI (GLOVE) ×1 IMPLANT
GLOVE SURG ENC MOIS LTX SZ6.5 (GLOVE) ×6 IMPLANT
GLOVE SURG LTX SZ8 (GLOVE) ×2 IMPLANT
GLOVE SURG UNDER POLY LF SZ6.5 (GLOVE) ×4 IMPLANT
GLOVE SURG UNDER POLY LF SZ7 (GLOVE) ×4 IMPLANT
GLOVE SURG UNDER POLY LF SZ8 (GLOVE) ×2
GOWN STRL REUS W/ TWL LRG LVL3 (GOWN DISPOSABLE) ×3 IMPLANT
GOWN STRL REUS W/TWL LRG LVL3 (GOWN DISPOSABLE) ×6
GOWN STRL REUS W/TWL XL LVL3 (GOWN DISPOSABLE) ×2 IMPLANT
GUIDEWIRE GLENOID 2.5X220 (WIRE) ×2 IMPLANT
HANDPIECE INTERPULSE COAX TIP (DISPOSABLE) ×2
IMPL REVERSE SHOULDER 0X3.5 (Shoulder) ×1 IMPLANT
IMPLANT REVERSE SHOULDER 0X3.5 (Shoulder) ×2 IMPLANT
INSERT SHLD REV 42X6 ANGLE B (Insert) ×2 IMPLANT
KIT STABILIZATION SHOULDER (MISCELLANEOUS) ×2 IMPLANT
MANIFOLD NEPTUNE II (INSTRUMENTS) ×2 IMPLANT
PACK BASIN DAY SURGERY FS (CUSTOM PROCEDURE TRAY) ×2 IMPLANT
PACK SHOULDER (CUSTOM PROCEDURE TRAY) ×2 IMPLANT
PAD COLD SHLDR WRAP-ON (PAD) ×2 IMPLANT
PAD ORTHO SHOULDER 7X19 LRG (SOFTGOODS) ×2 IMPLANT
PENCIL SMOKE EVACUATOR (MISCELLANEOUS) ×2 IMPLANT
RESTRAINT HEAD UNIVERSAL NS (MISCELLANEOUS) ×2 IMPLANT
SCREW 5.5X26 (Screw) ×2 IMPLANT
SCREW BONE 6.5 OD 30 NON BIO (Screw) ×2 IMPLANT
SCREW PERIPHERAL 30 (Screw) ×2 IMPLANT
SET HNDPC FAN SPRY TIP SCT (DISPOSABLE) ×1 IMPLANT
SHEET MEDIUM DRAPE 40X70 STRL (DRAPES) ×2 IMPLANT
SLEEVE SCD COMPRESS KNEE MED (STOCKING) ×2 IMPLANT
SPONGE T-LAP 18X18 ~~LOC~~+RFID (SPONGE) ×2 IMPLANT
STEM HUMERAL SZ1B LONG 88 PTC (Shoulder) ×2 IMPLANT
SUT ETHIBOND 2 V 37 (SUTURE) ×2 IMPLANT
SUT ETHIBOND NAB CT1 #1 30IN (SUTURE) ×2 IMPLANT
SUT FIBERWIRE #5 38 CONV NDL (SUTURE) ×12
SUT MNCRL AB 4-0 PS2 18 (SUTURE) ×2 IMPLANT
SUT VIC AB 0 CT1 27 (SUTURE)
SUT VIC AB 0 CT1 27XCR 8 STRN (SUTURE) IMPLANT
SUT VIC AB 3-0 SH 27 (SUTURE) ×2
SUT VIC AB 3-0 SH 27X BRD (SUTURE) ×1 IMPLANT
SUTURE FIBERWR #5 38 CONV NDL (SUTURE) ×6 IMPLANT
TOWEL GREEN STERILE FF (TOWEL DISPOSABLE) ×6 IMPLANT
TUBE SUCTION HIGH CAP CLEAR NV (SUCTIONS) IMPLANT

## 2020-11-18 NOTE — Anesthesia Procedure Notes (Signed)
Anesthesia Regional Block: Interscalene brachial plexus block   Pre-Anesthetic Checklist: , timeout performed,  Correct Patient, Correct Site, Correct Laterality,  Correct Procedure, Correct Position, site marked,  Risks and benefits discussed,  Surgical consent,  Pre-op evaluation,  At surgeon's request and post-op pain management  Laterality: Left  Prep: chloraprep       Needles:  Injection technique: Single-shot  Needle Type: Echogenic Stimulator Needle     Needle Length: 5cm  Needle Gauge: 22     Additional Needles:   Procedures:, nerve stimulator,,, ultrasound used (permanent image in chart),,     Nerve Stimulator or Paresthesia:  Response: hand, 0.45 mA  Additional Responses:   Narrative:  Start time: 11/18/2020 7:50 AM End time: 11/18/2020 7:55 AM Injection made incrementally with aspirations every 5 mL.  Performed by: Personally  Anesthesiologist: Bethena Midget, MD  Additional Notes: Functioning IV was confirmed and monitors were applied.  A 49mm 22ga Arrow echogenic stimulator needle was used. Sterile prep and drape,hand hygiene and sterile gloves were used. Ultrasound guidance: relevant anatomy identified, needle position confirmed, local anesthetic spread visualized around nerve(s)., vascular puncture avoided.  Image printed for medical record. Negative aspiration and negative test dose prior to incremental administration of local anesthetic. The patient tolerated the procedure well.

## 2020-11-18 NOTE — Op Note (Addendum)
Orthopaedic Surgery Operative Note (CSN: [id number])  Seth Lindsey  20-Feb-1966 Date of Surgery: 11/18/2020   Diagnoses:  Left proximal humerus malunion  Procedure: Left reverse total Shoulder Arthroplasty Operative treatment of humeral malunion   Operative Finding Successful completion of planned procedure.  Patients humeral anatomy was atypical with a malunion with a head migrated posterior relative to the shaft.  There is also a frank internal rotation component to the proximal part of the head limiting the excursion of the subscapularis.  His previous rotator cuff repair was partially torn.  We removed sutures and anchors to clear path.  We ensured that were in the humeral shaft and used longstem components to obtain fixation.  Axillary nerve tug test was intact at the beginning and end of the case.  He was very stable at the end of the case.  Post-operative plan: The patient will be NWB in sling.  The patient will be will be discharged from PACU if continues to be stable as was plan prior to surgery.  DVT prophylaxis Aspirin 81 mg twice daily for 6 weeks.  Pain control with PRN pain medication preferring oral medicines.  Follow up plan will be scheduled in approximately 7 days for incision check and XR.  Physical therapy to start in the next 3 days or so.  Implants: Tornier size 1B long stem, 0 high offset tray with 42+6 polyethylene, 25 standard baseplate with a 30 center screw and 42 standard glenosphere  Post-Op Diagnosis: Same Surgeons:Primary: Seth Gash, MD Assistants:Caroline McBane PA-C Location: [place] OR ROOM 6 Anesthesia: General with Exparel Interscalene Antibiotics: Ancef 2g preop, Vancomycin 106m locally Tourniquet time: None Estimated Blood Loss: 1875Complications: None Specimens: None Implants: Implant Name Type Inv. Item Serial No. Manufacturer Lot No. LRB No. Used Action  BASEPLATE GLENOSPHERE 264PPSTD - [id number] BASEPLATE GLENOSPHERE 206TKSTD  [id number]  Left 1 Implanted  BASEPLATE GLENOID STD REV 42 - [id number] BASEPLATE GLENOID STD REV 42 [id number] [employer]  Left 1 Implanted  SCREW BONE 6.5 OD 30 NON BIO - [id number] SCREW BONE 6.5 OD 30 NON BIO  [employer] STERILIZED ON SET Left 1 Implanted  SCREW 5.5X26 - [id number] SCREW 5.5X26  [employer] STERILIZED ON SET Left 1 Implanted  SCREW PERIPHERAL 30 - [id number] SCREW PERIPHERAL 30  [employer] STERILZIED ON SET Left 1 Implanted  IMPLANT REVERSE SHOULDER 0X3.5 - [id number] IMPLANT REVERSE SHOULDER 0X3.5 [id number] [employer]  Left 1 Implanted  PTC Humeral Stem   [id number]  Left 1 Implanted  INSERT SHLD REV 42X6 ANGLE B - [id number] INSERT SHLD REV 42X6 ANGLE B [id number]  Left 1 Implanted    Indications for Surgery:   Seth Javidis a 55y.o. male with previous proximal humerus fracture that went on to malunion in the setting of a previous rotator cuff repair.  He had extremely limited mobility secondary to his malunion and desired improvement in pain as well as function.  Benefits and risks of operative and nonoperative management were discussed prior to surgery with patient/guardian(s) and informed consent form was completed.  Infection and need for further surgery were discussed as was prosthetic stability and cuff issues.  We additionally specifically discussed risks of axillary nerve injury, infection, periprosthetic fracture, continued pain and longevity of implants prior to beginning procedure.      Procedure:   The patient was identified in the preoperative holding area where the surgical site was marked.  Block placed by anesthesia with exparel.  The patient was taken to the OR where a procedural timeout was called and the above noted anesthesia was induced.  The patient was positioned beachchair on allen table with spider arm positioner.  Preoperative antibiotics were dosed.  The patient's left shoulder was  prepped and draped in the usual sterile fashion.  A second preoperative timeout was called.       Standard deltopectoral approach was performed with a #10 blade. We dissected down to the subcutaneous tissues and the cephalic vein was taken laterally with the deltoid. Clavipectoral fascia was incised in line with the incision. Deep retractors were placed. The long of the biceps tendon was identified and there was significant tenosynovitis present.  Tenodesis was performed to the pectoralis tendon with #2 Ethibond. The remaining biceps was followed up into the rotator interval where it was released.   The subscapularis was taken down in a full thickness layer with capsule along the humeral neck extending inferiorly around the humeral head. We continued releasing the capsule directly off of the osteophytes inferiorly all the way around the corner. This allowed [place] to dislocate the humeral head.  This was more difficult than typical because of the patient's malunited humerus.  The humeral head was malunited and had an intra-articular split with a significant portion, about 20%, of the articular surface that had malunited posterior sitting posterior to the glenoid.  We were able to dislocate the head and begin our cuts.  There were osteophytes along the inferior humeral neck. The osteophytes were removed with an osteotome and a rongeur.  Osteophytes were removed with a rongeur and an osteotome and the anatomic neck was well visualized.     A humeral cutting guide was inserted down the intramedullary canal. The version was set at 20 of retroversion. Humeral osteotomy was performed with an oscillating saw. The head fragment was passed off the back table. We used an osteotome to carefully perform an osteotomy of the malunited humeral head fragment which displaced posteriorly and would have left [place] at high risk of instability.  We were able to remove this and smoothly contour the humerus posteriorly to avoid  overhanging bone.  While this meant we had to release some of the anterior superior cuff, the posterior teres was still intact.    A starter awl was used to open the humeral canal. We next used T-handle straight sound reamers to ream up to an appropriate fit. A chisel was used to remove proximal humeral bone. We then broached starting with a size one broach and broaching up to 1 long which obtained an appropriate fit. The broach handle was removed. A cut protector was placed. The broach handle was removed and a cut protector was placed. The humerus was retracted posteriorly and we turned our attention to glenoid exposure.  The subscapularis was again identified and immediately we took care to palpate the axillary nerve anteriorly and verify its position with gentle palpation as well as the tug test.  We then released the SGHL with bovie cautery prior to placing a curved mayo at the junction of the anterior glenoid well above the axillary nerve and bluntly dissecting the subscapularis from the capsule.  We then carefully protected the axillary nerve as we gently released the inferior capsule to fully mobilize the subscapularis.  An anterior deltoid retractor was then placed as well as a small Hohmann retractor superiorly.   The glenoid had some atypical labral appearance but was otherwise normal.  The remaining labrum was removed circumferentially taking great care not to disrupt the posterior capsule.   The glenoid drill guide was placed and used to drill a guide pin in the center, inferior position. The glenoid face was then reamed concentrically over the guide wire. The center hole was drilled over the guidepin in a near anatomic angle of version. Next the glenoid vault was drilled back to a depth of 30 mm.  We tapped and then placed a 89m size baseplate with additional 052mlateralization was selected with a 6.5 mm x 30 mm length central screw.  The base plate was screwed into the glenoid vault  obtaining secure fixation. We next placed superior and inferior locking screws for additional fixation.  Next a 42 mm glenosphere was selected and impacted onto the baseplate. The center screw was tightened.  We turned attention back to the humeral side. The cut protector was removed. We trialed with multiple size tray and polyethylene options and selected a 6 which provided good stability and range of motion without excess soft tissue tension.  We did have to make a recut to prevent being too tight and sank 1 stem slightly further as we had initially broached to a 2.  The offset was dialed in to match the normal anatomy. The shoulder was trialed.  There was good ROM in all planes and the shoulder was stable with no inferior translation.  The real humeral implants were opened after again confirming sizes.  The trial was removed. #5 Fiberwire x4 sutures passed through the humeral neck for subscap repair. The humeral component was press-fit obtaining a secure fit. A +0 high offset tray was selected and impacted onto the stem.  A 42+6 polyethylene liner was impacted onto the stem.  The joint was reduced and thoroughly irrigated with pulsatile lavage. Subscap was repaired back with #5 Fiberwire sutures through bone tunnels. Hemostasis was obtained. The deltopectoral interval was reapproximated with #1 Ethibond. The subcutaneous tissues were closed with 2-0 Vicryl and the skin was closed with running monocryl.    The wounds were cleaned and dried and an Aquacel dressing was placed. The drapes taken down. The arm was placed into sling with abduction pillow. Patient was awakened, extubated, and transferred to the recovery room in stable condition. There were no intraoperative complications. The sponge, needle, and attention counts were  correct at the end of the case.       CaNoemi ChapelPA-C, present and scrubbed throughout the case, critical for completion in a timely fashion, and for retraction,  instrumentation, closure.

## 2020-11-18 NOTE — Anesthesia Postprocedure Evaluation (Signed)
Anesthesia Post Note  Patient: Thierno Elliston  Procedure(s) Performed: REVERSE SHOULDER ARTHROPLASTY (Left: Shoulder)     Patient location during evaluation: PACU Anesthesia Type: General Level of consciousness: awake and alert Pain management: pain level controlled Vital Signs Assessment: post-procedure vital signs reviewed and stable Respiratory status: spontaneous breathing, nonlabored ventilation, respiratory function stable and patient connected to nasal cannula oxygen Cardiovascular status: blood pressure returned to baseline and stable Postop Assessment: no apparent nausea or vomiting Anesthetic complications: no   No notable events documented.  Last Vitals:  Vitals:   11/18/20 1204 11/18/20 1222  BP: 108/62 108/62  Pulse: 88 87  Resp: 20 20  Temp:  36.4 C  SpO2: 94% 94%    Last Pain:  Vitals:   11/18/20 1222  TempSrc: Oral  PainSc: 5                  Maloree Uplinger

## 2020-11-18 NOTE — Discharge Instructions (Addendum)
Ramond Marrow MD, MPH Alfonse Alpers, PA-C [facility] [address], [address] [phone] (tel)   [phone] (fax)   POST-OPERATIVE INSTRUCTIONS - TOTAL SHOULDER REPLACEMENT    WOUND CARE You may leave the operative dressing in place until your follow-up appointment. KEEP THE INCISIONS CLEAN AND DRY. There may be a small amount of fluid/bleeding leaking at the surgical site. This is normal after surgery.  If it fills with liquid or blood please call us immediately to change it for you. Use the provided ice machine or Ice packs as often as possible for the first 3-4 days, then as needed for pain relief.  Keep a layer of cloth or a shirt between your skin and the cooling unit to prevent frost bite as it can get very cold.  SHOWERING: - You may shower on Post-Op Day #2.  - The dressing is water resistant but do not scrub it as it may start to peel up.   - You may remove the sling for showering, but keep a water resistant pillow under the arm to keep both the  elbow and shoulder away from the body (mimicking the abduction sling).  - Gently pat the area dry.  - Do not soak the shoulder in water. Do not go swimming in the pool or ocean until your incision has completely healed about 4-6 weeks after surgery - KEEP THE INCISIONS CLEAN AND DRY.  EXERCISES Wear the sling at all times  You may remove the sling for showering, but keep the arm across the chest or in a secondary sling.    Accidental/Purposeful External Rotation and shoulder flexion (reaching behind you) is to be avoided at all costs for the first month. It is ok to come out of your sling if your are sitting and have assistance for eating.   Do not lift anything heavier than 1 pound until we discuss it further in clinic.   REGIONAL ANESTHESIA (NERVE BLOCKS) The anesthesia team may have performed a nerve block for you if safe in the setting of your care.  This is a great tool used to minimize pain.   Typically the block may start wearing off overnight but the long acting medicine may last for 3-4 days.  The nerve block wearing off can be a challenging period but please utilize your as needed pain medications to try and manage this period.    POST-OP MEDICATIONS- Multimodal approach to pain control In general your pain will be controlled with a combination of substances.  Prescriptions unless otherwise discussed are electronically sent to your pharmacy.  This is a carefully made plan we use to minimize narcotic use.     Celebrex - Anti-inflammatory medication taken on a scheduled basis Acetaminophen - Non-narcotic pain medicine taken on a scheduled basis  Gabapentin - this is a medication to help with pain, take on a scheduled basis Oxycodone - This is a strong narcotic, to be used only on an "as needed" basis for SEVERE pain. Aspirin 81mg  - This medicine is used to minimize the risk of blood clots after surgery. Omeprazole - daily medicine to protect your stomach while taking anti-inflammatories.   Zofran -  take as needed for nausea   FOLLOW-UP If you develop a Fever (>101.5), Redness or Drainage from the surgical incision site, please call our office to arrange for an evaluation. Please call the office to schedule a follow-up appointment for a wound check, 7-10 days post-operatively.  IF YOU HAVE ANY QUESTIONS, PLEASE  FEEL FREE TO CALL OUR OFFICE.  HELPFUL INFORMATION  If you had a block, it will wear off between 8-24 hrs postop typically.  This is period when your pain may go from nearly zero to the pain you would have had post-op without the block.  This is an abrupt transition but nothing dangerous is happening.  You may take an extra dose of narcotic when this happens.  Your arm will be in a sling following surgery. You will be in this sling for the next 4 weeks.  I will let you know the exact duration at your follow-up visit.  You may be more comfortable sleeping in a semi-seated  position the first few nights following surgery.  Keep a pillow propped under the elbow and forearm for comfort.  If you have a recliner type of chair it might be beneficial.  If not that is fine too, but it would be helpful to sleep propped up with pillows behind your operated shoulder as well under your elbow and forearm.  This will reduce pulling on the suture lines.  When dressing, put your operative arm in the sleeve first.  When getting undressed, take your operative arm out last.  Loose fitting, button-down shirts are recommended.  In most states it is against the law to drive while your arm is in a sling. And certainly against the law to drive while taking narcotics.  You may return to work/school in the next couple of days when you feel up to it. Desk work and typing in the sling is fine.  We suggest you use the pain medication the first night prior to going to bed, in order to ease any pain when the anesthesia wears off. You should avoid taking pain medications on an empty stomach as it will make you nauseous.  Do not drink alcoholic beverages or take illicit drugs when taking pain medications.  Pain medication may make you constipated.  Below are a few solutions to try in this order: Decrease the amount of pain medication if you aren't having pain. Drink lots of decaffeinated fluids. Drink prune juice and/or each dried prunes  If the first 3 don't work start with additional solutions Take Colace - an over-the-counter stool softener Take Senokot - an over-the-counter laxative Take Miralax - a stronger over-the-counter laxative   Dental Antibiotics:  In most cases prophylactic antibiotics for Dental procdeures after total joint surgery are not necessary.  Exceptions are as follows:  1. History of prior total joint infection  2. Severely immunocompromised (Organ Transplant, cancer chemotherapy, Rheumatoid biologic meds such as Humera)  3. Poorly controlled diabetes (A1C &gt;  8.0, blood glucose over 200)  If you have one of these conditions, contact your surgeon for an antibiotic prescription, prior to your dental procedure.   For more information including helpful videos and documents visit our website:   [email or web address]   Can take Tylenol after 1pm for pain as needed.   Post Anesthesia Home Care Instructions  Activity: Get plenty of rest for the remainder of the day. A responsible individual must stay with you for 24 hours following the procedure.  For the next 24 hours, DO NOT: -Drive a car -Advertising copywriter -Drink alcoholic beverages -Take any medication unless instructed by your physician -Make any legal decisions or sign important papers.  Meals: Start with liquid foods such as gelatin or soup. Progress to regular foods as tolerated. Avoid greasy, spicy, heavy foods. If nausea and/or vomiting occur, drink only clear liquids until  the nausea and/or vomiting subsides. Call your physician if vomiting continues.  Special Instructions/Symptoms: Your throat may feel dry or sore from the anesthesia or the breathing tube placed in your throat during surgery. If this causes discomfort, gargle with warm salt water. The discomfort should disappear within 24 hours.  If you had a scopolamine patch placed behind your ear for the management of post- operative nausea and/or vomiting:  1. The medication in the patch is effective for 72 hours, after which it should be removed.  Wrap patch in a tissue and discard in the trash. Wash hands thoroughly with soap and water. 2. You may remove the patch earlier than 72 hours if you experience unpleasant side effects which may include dry mouth, dizziness or visual disturbances. 3. Avoid touching the patch. Wash your hands with soap and water after contact with the patch.

## 2020-11-18 NOTE — Progress Notes (Signed)
Assisted Dr. Oddono with left, ultrasound guided, interscalene  block. Side rails up, monitors on throughout procedure. See vital signs in flow sheet. Tolerated Procedure well. 

## 2020-11-18 NOTE — Anesthesia Procedure Notes (Signed)
Procedure Name: Intubation Date/Time: 11/18/2020 8:30 AM Performed by: Lavonia Dana, CRNA Pre-anesthesia Checklist: Patient identified, Emergency Drugs available, Suction available and Patient being monitored Patient Re-evaluated:Patient Re-evaluated prior to induction Oxygen Delivery Method: Circle system utilized Preoxygenation: Pre-oxygenation with 100% oxygen Induction Type: IV induction Ventilation: Mask ventilation without difficulty and Oral airway inserted - appropriate to patient size Laryngoscope Size: Mac and 4 Grade View: Grade I Tube type: Oral Tube size: 7.5 mm Number of attempts: 1 Airway Equipment and Method: Stylet, Oral airway and Bite block Placement Confirmation: ETT inserted through vocal cords under direct vision, positive ETCO2 and breath sounds checked- equal and bilateral Secured at: 22 cm Tube secured with: Tape Dental Injury: Teeth and Oropharynx as per pre-operative assessment

## 2020-11-18 NOTE — Interval H&P Note (Signed)
All questions answered, patient wants to proceed with procedure. ? ?

## 2020-11-18 NOTE — Transfer of Care (Signed)
Immediate Anesthesia Transfer of Care Note  Patient: Seth Lindsey  Procedure(s) Performed: REVERSE SHOULDER ARTHROPLASTY (Left: Shoulder)  Patient Location: PACU  Anesthesia Type:GA combined with regional for post-op pain  Level of Consciousness: drowsy  Airway & Oxygen Therapy: Patient Spontanous Breathing and Patient connected to face mask oxygen  Post-op Assessment: Report given to RN and Post -op Vital signs reviewed and stable  Post vital signs: Reviewed and stable  Last Vitals:  Vitals Value Taken Time  BP 98/62 11/18/20 1015  Temp    Pulse 79 11/18/20 1016  Resp 17 11/18/20 1016  SpO2 95 % 11/18/20 1016  Vitals shown include unvalidated device data.  Last Pain:  Vitals:   11/18/20 0653  TempSrc: Oral  PainSc: 10-Worst pain ever      Patients Stated Pain Goal: 6 (11/18/20 5449)  Complications: No notable events documented.

## 2020-11-20 DIAGNOSIS — M25612 Stiffness of left shoulder, not elsewhere classified: Secondary | ICD-10-CM | POA: Diagnosis not present

## 2020-11-20 DIAGNOSIS — M6281 Muscle weakness (generalized): Secondary | ICD-10-CM | POA: Diagnosis not present

## 2020-11-20 DIAGNOSIS — M25512 Pain in left shoulder: Secondary | ICD-10-CM | POA: Diagnosis not present

## 2020-11-23 ENCOUNTER — Other Ambulatory Visit (INDEPENDENT_AMBULATORY_CARE_PROVIDER_SITE_OTHER): Payer: Self-pay

## 2020-11-23 DIAGNOSIS — Z79899 Other long term (current) drug therapy: Secondary | ICD-10-CM | POA: Diagnosis not present

## 2020-11-23 DIAGNOSIS — Z0289 Encounter for other administrative examinations: Secondary | ICD-10-CM

## 2020-11-23 DIAGNOSIS — M25612 Stiffness of left shoulder, not elsewhere classified: Secondary | ICD-10-CM | POA: Diagnosis not present

## 2020-11-23 DIAGNOSIS — G35 Multiple sclerosis: Secondary | ICD-10-CM | POA: Diagnosis not present

## 2020-11-23 DIAGNOSIS — M25512 Pain in left shoulder: Secondary | ICD-10-CM | POA: Diagnosis not present

## 2020-11-23 DIAGNOSIS — M6281 Muscle weakness (generalized): Secondary | ICD-10-CM | POA: Diagnosis not present

## 2020-11-24 ENCOUNTER — Telehealth: Payer: Self-pay | Admitting: Neurology

## 2020-11-24 ENCOUNTER — Encounter (HOSPITAL_BASED_OUTPATIENT_CLINIC_OR_DEPARTMENT_OTHER): Payer: Self-pay | Admitting: Orthopaedic Surgery

## 2020-11-24 LAB — HEPATIC FUNCTION PANEL
ALT: 11 IU/L (ref 0–44)
AST: 11 IU/L (ref 0–40)
Albumin: 4.1 g/dL (ref 3.8–4.9)
Alkaline Phosphatase: 69 IU/L (ref 44–121)
Bilirubin Total: 0.6 mg/dL (ref 0.0–1.2)
Bilirubin, Direct: 0.23 mg/dL (ref 0.00–0.40)
Total Protein: 6.2 g/dL (ref 6.0–8.5)

## 2020-11-24 NOTE — Telephone Encounter (Signed)
Called the pt and reviewed the lab results with him. Pt verbalized understanding. Pt had no questions at this time but was encouraged to call back if questions arise.  

## 2020-11-24 NOTE — Telephone Encounter (Signed)
-----   Message from Asa Lente, MD sent at 11/24/2020  8:52 AM EDT ----- Please let the patient know that the lab work is fine.

## 2020-11-25 ENCOUNTER — Other Ambulatory Visit: Payer: Self-pay | Admitting: Neurology

## 2020-11-27 DIAGNOSIS — M25512 Pain in left shoulder: Secondary | ICD-10-CM | POA: Diagnosis not present

## 2020-12-02 DIAGNOSIS — F331 Major depressive disorder, recurrent, moderate: Secondary | ICD-10-CM | POA: Diagnosis not present

## 2020-12-02 DIAGNOSIS — F411 Generalized anxiety disorder: Secondary | ICD-10-CM | POA: Diagnosis not present

## 2020-12-02 DIAGNOSIS — F4312 Post-traumatic stress disorder, chronic: Secondary | ICD-10-CM | POA: Diagnosis not present

## 2020-12-04 DIAGNOSIS — M25612 Stiffness of left shoulder, not elsewhere classified: Secondary | ICD-10-CM | POA: Diagnosis not present

## 2020-12-04 DIAGNOSIS — M25512 Pain in left shoulder: Secondary | ICD-10-CM | POA: Diagnosis not present

## 2020-12-04 DIAGNOSIS — M6281 Muscle weakness (generalized): Secondary | ICD-10-CM | POA: Diagnosis not present

## 2020-12-07 DIAGNOSIS — M6281 Muscle weakness (generalized): Secondary | ICD-10-CM | POA: Diagnosis not present

## 2020-12-07 DIAGNOSIS — M25612 Stiffness of left shoulder, not elsewhere classified: Secondary | ICD-10-CM | POA: Diagnosis not present

## 2020-12-07 DIAGNOSIS — M25512 Pain in left shoulder: Secondary | ICD-10-CM | POA: Diagnosis not present

## 2020-12-11 DIAGNOSIS — M6281 Muscle weakness (generalized): Secondary | ICD-10-CM | POA: Diagnosis not present

## 2020-12-11 DIAGNOSIS — M25612 Stiffness of left shoulder, not elsewhere classified: Secondary | ICD-10-CM | POA: Diagnosis not present

## 2020-12-11 DIAGNOSIS — M25512 Pain in left shoulder: Secondary | ICD-10-CM | POA: Diagnosis not present

## 2020-12-15 DIAGNOSIS — Z789 Other specified health status: Secondary | ICD-10-CM | POA: Diagnosis not present

## 2020-12-15 DIAGNOSIS — I10 Essential (primary) hypertension: Secondary | ICD-10-CM | POA: Diagnosis not present

## 2020-12-18 DIAGNOSIS — M25512 Pain in left shoulder: Secondary | ICD-10-CM | POA: Diagnosis not present

## 2020-12-18 DIAGNOSIS — M6281 Muscle weakness (generalized): Secondary | ICD-10-CM | POA: Diagnosis not present

## 2020-12-18 DIAGNOSIS — M25612 Stiffness of left shoulder, not elsewhere classified: Secondary | ICD-10-CM | POA: Diagnosis not present

## 2020-12-31 ENCOUNTER — Telehealth: Payer: Self-pay

## 2020-12-31 NOTE — Telephone Encounter (Signed)
I called patient. I advised him that he is due for his monthly lab work. I gave patient office hours. Pt verbalized understanding.

## 2021-01-01 DIAGNOSIS — M25512 Pain in left shoulder: Secondary | ICD-10-CM | POA: Diagnosis not present

## 2021-01-01 DIAGNOSIS — Z471 Aftercare following joint replacement surgery: Secondary | ICD-10-CM | POA: Diagnosis not present

## 2021-01-01 DIAGNOSIS — Z96612 Presence of left artificial shoulder joint: Secondary | ICD-10-CM | POA: Diagnosis not present

## 2021-01-01 DIAGNOSIS — M19012 Primary osteoarthritis, left shoulder: Secondary | ICD-10-CM | POA: Diagnosis not present

## 2021-01-01 DIAGNOSIS — M6281 Muscle weakness (generalized): Secondary | ICD-10-CM | POA: Diagnosis not present

## 2021-01-01 DIAGNOSIS — M25612 Stiffness of left shoulder, not elsewhere classified: Secondary | ICD-10-CM | POA: Diagnosis not present

## 2021-01-04 ENCOUNTER — Other Ambulatory Visit (INDEPENDENT_AMBULATORY_CARE_PROVIDER_SITE_OTHER): Payer: Self-pay

## 2021-01-04 DIAGNOSIS — Z0289 Encounter for other administrative examinations: Secondary | ICD-10-CM

## 2021-01-04 DIAGNOSIS — Z79899 Other long term (current) drug therapy: Secondary | ICD-10-CM | POA: Diagnosis not present

## 2021-01-04 DIAGNOSIS — G35 Multiple sclerosis: Secondary | ICD-10-CM

## 2021-01-05 LAB — HEPATIC FUNCTION PANEL
ALT: 16 IU/L (ref 0–44)
AST: 13 IU/L (ref 0–40)
Albumin: 4.4 g/dL (ref 3.8–4.9)
Alkaline Phosphatase: 79 IU/L (ref 44–121)
Bilirubin Total: 0.4 mg/dL (ref 0.0–1.2)
Bilirubin, Direct: 0.14 mg/dL (ref 0.00–0.40)
Total Protein: 6.5 g/dL (ref 6.0–8.5)

## 2021-01-06 ENCOUNTER — Telehealth: Payer: Self-pay

## 2021-01-06 NOTE — Progress Notes (Signed)
See telephone note from 01/06/21.

## 2021-01-06 NOTE — Telephone Encounter (Signed)
-----   Message from Asa Lente, MD sent at 01/05/2021 10:08 AM EST ----- Please let the patient know that the lab work is fine.

## 2021-01-06 NOTE — Telephone Encounter (Signed)
Called and spoke to pt of results per Dr. Epimenio Foot. Pt verbalized understand and was very appreciative of call.

## 2021-01-08 DIAGNOSIS — M19012 Primary osteoarthritis, left shoulder: Secondary | ICD-10-CM | POA: Diagnosis not present

## 2021-01-08 DIAGNOSIS — M25612 Stiffness of left shoulder, not elsewhere classified: Secondary | ICD-10-CM | POA: Diagnosis not present

## 2021-01-08 DIAGNOSIS — M6281 Muscle weakness (generalized): Secondary | ICD-10-CM | POA: Diagnosis not present

## 2021-01-08 DIAGNOSIS — Z471 Aftercare following joint replacement surgery: Secondary | ICD-10-CM | POA: Diagnosis not present

## 2021-01-08 DIAGNOSIS — M25512 Pain in left shoulder: Secondary | ICD-10-CM | POA: Diagnosis not present

## 2021-01-08 DIAGNOSIS — Z96612 Presence of left artificial shoulder joint: Secondary | ICD-10-CM | POA: Diagnosis not present

## 2021-01-11 DIAGNOSIS — Z471 Aftercare following joint replacement surgery: Secondary | ICD-10-CM | POA: Diagnosis not present

## 2021-01-11 DIAGNOSIS — M19012 Primary osteoarthritis, left shoulder: Secondary | ICD-10-CM | POA: Diagnosis not present

## 2021-01-11 DIAGNOSIS — M25512 Pain in left shoulder: Secondary | ICD-10-CM | POA: Diagnosis not present

## 2021-01-11 DIAGNOSIS — M25612 Stiffness of left shoulder, not elsewhere classified: Secondary | ICD-10-CM | POA: Diagnosis not present

## 2021-01-11 DIAGNOSIS — Z96612 Presence of left artificial shoulder joint: Secondary | ICD-10-CM | POA: Diagnosis not present

## 2021-01-11 DIAGNOSIS — M6281 Muscle weakness (generalized): Secondary | ICD-10-CM | POA: Diagnosis not present

## 2021-01-13 DIAGNOSIS — I10 Essential (primary) hypertension: Secondary | ICD-10-CM | POA: Diagnosis not present

## 2021-01-13 DIAGNOSIS — D751 Secondary polycythemia: Secondary | ICD-10-CM | POA: Diagnosis not present

## 2021-01-13 DIAGNOSIS — K591 Functional diarrhea: Secondary | ICD-10-CM | POA: Diagnosis not present

## 2021-01-27 DIAGNOSIS — F331 Major depressive disorder, recurrent, moderate: Secondary | ICD-10-CM | POA: Diagnosis not present

## 2021-01-27 DIAGNOSIS — F411 Generalized anxiety disorder: Secondary | ICD-10-CM | POA: Diagnosis not present

## 2021-01-27 DIAGNOSIS — F4312 Post-traumatic stress disorder, chronic: Secondary | ICD-10-CM | POA: Diagnosis not present

## 2021-02-01 ENCOUNTER — Telehealth: Payer: Self-pay | Admitting: Neurology

## 2021-02-01 NOTE — Telephone Encounter (Signed)
Called pt back. He will get labs day of appt after seeing Dr. Epimenio Foot.

## 2021-02-01 NOTE — Telephone Encounter (Signed)
Patient wants to know if he can get his blood work early since he has an appt 12/8. Please let im know. Thank you

## 2021-02-04 ENCOUNTER — Ambulatory Visit: Payer: Medicare HMO | Admitting: Neurology

## 2021-02-04 ENCOUNTER — Other Ambulatory Visit: Payer: Self-pay

## 2021-02-04 ENCOUNTER — Encounter: Payer: Self-pay | Admitting: Neurology

## 2021-02-04 VITALS — BP 102/74 | HR 80 | Ht 69.0 in | Wt 213.5 lb

## 2021-02-04 DIAGNOSIS — Z79899 Other long term (current) drug therapy: Secondary | ICD-10-CM

## 2021-02-04 DIAGNOSIS — G4700 Insomnia, unspecified: Secondary | ICD-10-CM

## 2021-02-04 DIAGNOSIS — G2581 Restless legs syndrome: Secondary | ICD-10-CM

## 2021-02-04 DIAGNOSIS — R252 Cramp and spasm: Secondary | ICD-10-CM | POA: Diagnosis not present

## 2021-02-04 DIAGNOSIS — F418 Other specified anxiety disorders: Secondary | ICD-10-CM | POA: Diagnosis not present

## 2021-02-04 DIAGNOSIS — R269 Unspecified abnormalities of gait and mobility: Secondary | ICD-10-CM

## 2021-02-04 DIAGNOSIS — G35 Multiple sclerosis: Secondary | ICD-10-CM | POA: Diagnosis not present

## 2021-02-04 MED ORDER — GABAPENTIN 300 MG PO CAPS: 900.0000 mg | ORAL_CAPSULE | Freq: Three times a day (TID) | ORAL | 11 refills | Status: DC

## 2021-02-04 MED ORDER — DOXEPIN HCL 10 MG PO CAPS: 10.0000 mg | ORAL_CAPSULE | Freq: Every day | ORAL | 3 refills | Status: DC

## 2021-02-04 MED ORDER — ROPINIROLE HCL 0.5 MG PO TABS: 0.5000 mg | ORAL_TABLET | Freq: Three times a day (TID) | ORAL | 5 refills | Status: DC
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
## 2021-02-05 DIAGNOSIS — M25612 Stiffness of left shoulder, not elsewhere classified: Secondary | ICD-10-CM | POA: Diagnosis not present

## 2021-02-05 DIAGNOSIS — Z96612 Presence of left artificial shoulder joint: Secondary | ICD-10-CM | POA: Diagnosis not present

## 2021-02-05 DIAGNOSIS — M6281 Muscle weakness (generalized): Secondary | ICD-10-CM | POA: Diagnosis not present

## 2021-02-05 DIAGNOSIS — M19012 Primary osteoarthritis, left shoulder: Secondary | ICD-10-CM | POA: Diagnosis not present

## 2021-02-05 DIAGNOSIS — Z471 Aftercare following joint replacement surgery: Secondary | ICD-10-CM | POA: Diagnosis not present

## 2021-02-05 DIAGNOSIS — M25512 Pain in left shoulder: Secondary | ICD-10-CM | POA: Diagnosis not present

## 2021-02-05 LAB — CBC WITH DIFFERENTIAL/PLATELET
Basophils Absolute: 0 10*3/uL (ref 0.0–0.2)
Basos: 1 %
EOS (ABSOLUTE): 0.1 10*3/uL (ref 0.0–0.4)
Eos: 1 %
Hematocrit: 46.2 % (ref 37.5–51.0)
Hemoglobin: 15.4 g/dL (ref 13.0–17.7)
Immature Grans (Abs): 0 10*3/uL (ref 0.0–0.1)
Immature Granulocytes: 0 %
Lymphocytes Absolute: 1.5 10*3/uL (ref 0.7–3.1)
Lymphs: 17 %
MCH: 30 pg (ref 26.6–33.0)
MCHC: 33.3 g/dL (ref 31.5–35.7)
MCV: 90 fL (ref 79–97)
Monocytes Absolute: 0.9 10*3/uL (ref 0.1–0.9)
Monocytes: 10 %
Neutrophils Absolute: 6 10*3/uL (ref 1.4–7.0)
Neutrophils: 71 %
Platelets: 299 10*3/uL (ref 150–450)
RBC: 5.13 x10E6/uL (ref 4.14–5.80)
RDW: 11.9 % (ref 11.6–15.4)
WBC: 8.5 10*3/uL (ref 3.4–10.8)

## 2021-02-05 LAB — HEPATIC FUNCTION PANEL
ALT: 16 IU/L (ref 0–44)
AST: 16 IU/L (ref 0–40)
Albumin: 4.4 g/dL (ref 3.8–4.9)
Alkaline Phosphatase: 72 IU/L (ref 44–121)
Bilirubin Total: 0.4 mg/dL (ref 0.0–1.2)
Bilirubin, Direct: 0.12 mg/dL (ref 0.00–0.40)
Total Protein: 6.6 g/dL (ref 6.0–8.5)

## 2021-02-05 LAB — HEPATITIS C ANTIBODY: Hep C Virus Ab: 0.1 s/co ratio (ref 0.0–0.9)

## 2021-02-05 LAB — HEPATITIS B SURFACE ANTIBODY,QUALITATIVE: Hep B Surface Ab, Qual: NONREACTIVE

## 2021-02-05 LAB — HEPATITIS B SURFACE ANTIGEN: Hepatitis B Surface Ag: NEGATIVE

## 2021-02-05 LAB — HEPATITIS B CORE ANTIBODY, TOTAL: Hep B Core Total Ab: NEGATIVE

## 2021-02-05 LAB — IGG, IGA, IGM
IgA/Immunoglobulin A, Serum: 179 mg/dL (ref 90–386)
IgG (Immunoglobin G), Serum: 866 mg/dL (ref 603–1613)
IgM (Immunoglobulin M), Srm: 70 mg/dL (ref 20–172)

## 2021-02-05 LAB — HIV ANTIBODY (ROUTINE TESTING W REFLEX): HIV Screen 4th Generation wRfx: NONREACTIVE

## 2021-02-08 ENCOUNTER — Telehealth: Payer: Self-pay

## 2021-02-08 NOTE — Telephone Encounter (Signed)
Kesimpta start form faxed to [facility]. Received a receipt of confirmation.

## 2021-02-08 NOTE — Telephone Encounter (Signed)
I called patient. I discussed this with him. He would like to switch to [employer]. He has signed the start form. I advised him that we will send in the Kesimpta start form and attempt the PA. I will discuss with Dr. Epimenio Foot when patient should stop aubagio. Pt verbalized understanding.

## 2021-02-08 NOTE — Telephone Encounter (Signed)
Received Kesimpta start form from Dr. Epimenio Foot.  I spoke with Dr. Epimenio Foot. Patient should stop aubagio 2 weeks prior to starting Kesimpta.  I called patient. He would prefer to stop [place] tomorrow and let us know if he hasn't received Kesimpta in a few weeks.

## 2021-02-08 NOTE — Telephone Encounter (Signed)
-----   Message from Asa Lente, MD sent at 02/05/2021  8:36 AM EST ----- The lab work was okay.  If he wants to switch from [place] to [place] we can do so.  If we have trouble getting Kesimpta but he qualifies for Ocrevus we can do that instead

## 2021-02-08 NOTE — Telephone Encounter (Signed)
Completed PA for kesimpta on CMM. Key: [id number]. Sent to [facility]. Should have a determination within 3-5 business days.

## 2021-02-09 NOTE — Telephone Encounter (Signed)
PA for kesimpta approved by [facility] until 02/27/2022.

## 2021-02-12 DIAGNOSIS — M19012 Primary osteoarthritis, left shoulder: Secondary | ICD-10-CM | POA: Diagnosis not present

## 2021-02-12 DIAGNOSIS — M6281 Muscle weakness (generalized): Secondary | ICD-10-CM | POA: Diagnosis not present

## 2021-02-12 DIAGNOSIS — Z471 Aftercare following joint replacement surgery: Secondary | ICD-10-CM | POA: Diagnosis not present

## 2021-02-12 DIAGNOSIS — M25612 Stiffness of left shoulder, not elsewhere classified: Secondary | ICD-10-CM | POA: Diagnosis not present

## 2021-02-12 DIAGNOSIS — M25512 Pain in left shoulder: Secondary | ICD-10-CM | POA: Diagnosis not present

## 2021-02-12 DIAGNOSIS — Z96612 Presence of left artificial shoulder joint: Secondary | ICD-10-CM | POA: Diagnosis not present

## 2021-02-15 ENCOUNTER — Other Ambulatory Visit: Payer: Self-pay

## 2021-02-15 MED ORDER — AUBAGIO 14 MG PO TABS: 14.0000 mg | ORAL_TABLET | Freq: Every day | ORAL | 3 refills | Status: DC

## 2021-02-23 ENCOUNTER — Telehealth: Payer: Self-pay | Admitting: Neurology

## 2021-02-23 NOTE — Telephone Encounter (Signed)
[name] (Seth Lindsey) Pt reached to [place] for help with why medication was not able to be sent. PA is needed for starter dose. Would like a call from the nurse. If get voicemail please leave a phone number to able to call directl.

## 2021-02-24 NOTE — Telephone Encounter (Signed)
Submitted PA on CMM for loading dose (1 pen/0.22ml at week 0,1,2). Total of 1.83ml/initial 30 days. Then 0.53ml monthly after that. [id number]. Waiting on determination from [facility].

## 2021-02-24 NOTE — Telephone Encounter (Addendum)
Called [facility] at [phone]. Spoke w/ Hansel Starling. Advised loading dose approved as of today. Pt should now be able to fill. They verbalized understanding. Nothing further needed at this time.  She asked that we reach out to [facility] specialty pharmacy to clarify prescription. They have two on file and need to verify rx that was sent. Aware I will call. I called them at [phone]. Spoke w/ Mimi. She transferred me to pharmacist. Sherron Monday w/ Robynn Pane. Verified Kesimpta should be filled, not Aubagio. She transferred me to [facility] (pharmacist). Provided VO for loading/maintenance dose (11 refills). He will process, nothing further needed. He will get rx ready for pt.

## 2021-02-24 NOTE — Telephone Encounter (Signed)
PA Case: [id number], Status: Approved, Coverage Starts on: 02/29/2020 12:00:00 AM, Coverage Ends on: 02/27/2022 12:00:00 AM. Questions? Contact [phone].

## 2021-03-03 ENCOUNTER — Other Ambulatory Visit: Payer: Self-pay | Admitting: Neurology

## 2021-03-03 DIAGNOSIS — Z471 Aftercare following joint replacement surgery: Secondary | ICD-10-CM | POA: Diagnosis not present

## 2021-03-03 DIAGNOSIS — M25612 Stiffness of left shoulder, not elsewhere classified: Secondary | ICD-10-CM | POA: Diagnosis not present

## 2021-03-03 DIAGNOSIS — M25512 Pain in left shoulder: Secondary | ICD-10-CM | POA: Diagnosis not present

## 2021-03-03 DIAGNOSIS — M19012 Primary osteoarthritis, left shoulder: Secondary | ICD-10-CM | POA: Diagnosis not present

## 2021-03-03 DIAGNOSIS — M6281 Muscle weakness (generalized): Secondary | ICD-10-CM | POA: Diagnosis not present

## 2021-03-03 DIAGNOSIS — Z96612 Presence of left artificial shoulder joint: Secondary | ICD-10-CM | POA: Diagnosis not present

## 2021-03-05 DIAGNOSIS — M19012 Primary osteoarthritis, left shoulder: Secondary | ICD-10-CM | POA: Diagnosis not present

## 2021-03-08 NOTE — Telephone Encounter (Signed)
I called patient. He received his kesimpta shipment and has started it. He is tolerating it well. He will let us know if he has questions or concerns prior to his appointment in June.

## 2021-03-08 NOTE — Addendum Note (Signed)
Addended by: Geronimo Running A on: 03/08/2021 11:15 AM   Modules accepted: Orders

## 2021-03-12 DIAGNOSIS — Z96612 Presence of left artificial shoulder joint: Secondary | ICD-10-CM | POA: Diagnosis not present

## 2021-03-12 DIAGNOSIS — M25512 Pain in left shoulder: Secondary | ICD-10-CM | POA: Diagnosis not present

## 2021-03-12 DIAGNOSIS — Z471 Aftercare following joint replacement surgery: Secondary | ICD-10-CM | POA: Diagnosis not present

## 2021-03-12 DIAGNOSIS — M19012 Primary osteoarthritis, left shoulder: Secondary | ICD-10-CM | POA: Diagnosis not present

## 2021-03-12 DIAGNOSIS — M25612 Stiffness of left shoulder, not elsewhere classified: Secondary | ICD-10-CM | POA: Diagnosis not present

## 2021-03-12 DIAGNOSIS — M6281 Muscle weakness (generalized): Secondary | ICD-10-CM | POA: Diagnosis not present

## 2021-03-19 DIAGNOSIS — Z96612 Presence of left artificial shoulder joint: Secondary | ICD-10-CM | POA: Diagnosis not present

## 2021-03-19 DIAGNOSIS — Z471 Aftercare following joint replacement surgery: Secondary | ICD-10-CM | POA: Diagnosis not present

## 2021-03-19 DIAGNOSIS — M6281 Muscle weakness (generalized): Secondary | ICD-10-CM | POA: Diagnosis not present

## 2021-03-19 DIAGNOSIS — M19012 Primary osteoarthritis, left shoulder: Secondary | ICD-10-CM | POA: Diagnosis not present

## 2021-03-19 DIAGNOSIS — M25612 Stiffness of left shoulder, not elsewhere classified: Secondary | ICD-10-CM | POA: Diagnosis not present

## 2021-03-19 DIAGNOSIS — M25512 Pain in left shoulder: Secondary | ICD-10-CM | POA: Diagnosis not present

## 2021-04-09 DIAGNOSIS — M6281 Muscle weakness (generalized): Secondary | ICD-10-CM | POA: Diagnosis not present

## 2021-04-09 DIAGNOSIS — M25512 Pain in left shoulder: Secondary | ICD-10-CM | POA: Diagnosis not present

## 2021-04-09 DIAGNOSIS — Z471 Aftercare following joint replacement surgery: Secondary | ICD-10-CM | POA: Diagnosis not present

## 2021-04-09 DIAGNOSIS — M19012 Primary osteoarthritis, left shoulder: Secondary | ICD-10-CM | POA: Diagnosis not present

## 2021-04-09 DIAGNOSIS — M25612 Stiffness of left shoulder, not elsewhere classified: Secondary | ICD-10-CM | POA: Diagnosis not present

## 2021-04-09 DIAGNOSIS — Z96612 Presence of left artificial shoulder joint: Secondary | ICD-10-CM | POA: Diagnosis not present

## 2021-04-21 ENCOUNTER — Other Ambulatory Visit: Payer: Self-pay | Admitting: Neurology

## 2021-04-21 DIAGNOSIS — F331 Major depressive disorder, recurrent, moderate: Secondary | ICD-10-CM | POA: Diagnosis not present

## 2021-04-21 DIAGNOSIS — F411 Generalized anxiety disorder: Secondary | ICD-10-CM | POA: Diagnosis not present

## 2021-04-21 DIAGNOSIS — F4312 Post-traumatic stress disorder, chronic: Secondary | ICD-10-CM | POA: Diagnosis not present

## 2021-04-23 DIAGNOSIS — M19012 Primary osteoarthritis, left shoulder: Secondary | ICD-10-CM | POA: Diagnosis not present

## 2021-04-23 DIAGNOSIS — Z471 Aftercare following joint replacement surgery: Secondary | ICD-10-CM | POA: Diagnosis not present

## 2021-04-23 DIAGNOSIS — M25612 Stiffness of left shoulder, not elsewhere classified: Secondary | ICD-10-CM | POA: Diagnosis not present

## 2021-04-23 DIAGNOSIS — M6281 Muscle weakness (generalized): Secondary | ICD-10-CM | POA: Diagnosis not present

## 2021-04-23 DIAGNOSIS — M25512 Pain in left shoulder: Secondary | ICD-10-CM | POA: Diagnosis not present

## 2021-04-23 DIAGNOSIS — Z96612 Presence of left artificial shoulder joint: Secondary | ICD-10-CM | POA: Diagnosis not present

## 2021-05-14 DIAGNOSIS — M25512 Pain in left shoulder: Secondary | ICD-10-CM | POA: Diagnosis not present

## 2021-05-14 DIAGNOSIS — M19012 Primary osteoarthritis, left shoulder: Secondary | ICD-10-CM | POA: Diagnosis not present

## 2021-05-14 DIAGNOSIS — M25612 Stiffness of left shoulder, not elsewhere classified: Secondary | ICD-10-CM | POA: Diagnosis not present

## 2021-05-14 DIAGNOSIS — Z96612 Presence of left artificial shoulder joint: Secondary | ICD-10-CM | POA: Diagnosis not present

## 2021-05-14 DIAGNOSIS — M6281 Muscle weakness (generalized): Secondary | ICD-10-CM | POA: Diagnosis not present

## 2021-05-14 DIAGNOSIS — Z471 Aftercare following joint replacement surgery: Secondary | ICD-10-CM | POA: Diagnosis not present

## 2021-05-18 DIAGNOSIS — F4312 Post-traumatic stress disorder, chronic: Secondary | ICD-10-CM | POA: Diagnosis not present

## 2021-05-18 DIAGNOSIS — F331 Major depressive disorder, recurrent, moderate: Secondary | ICD-10-CM | POA: Diagnosis not present

## 2021-05-18 DIAGNOSIS — F411 Generalized anxiety disorder: Secondary | ICD-10-CM | POA: Diagnosis not present

## 2021-06-08 DIAGNOSIS — M19012 Primary osteoarthritis, left shoulder: Secondary | ICD-10-CM | POA: Diagnosis not present

## 2021-06-09 ENCOUNTER — Telehealth: Payer: Self-pay | Admitting: Neurology

## 2021-06-09 ENCOUNTER — Encounter: Payer: Self-pay | Admitting: *Deleted

## 2021-06-09 NOTE — Telephone Encounter (Signed)
Letter printed, waiting on MD signature and then will fax 

## 2021-06-09 NOTE — Telephone Encounter (Addendum)
Faxed letter, but fax failed. I called pt and confirmed fax#.  I faxed again and received fax confirmation. ?

## 2021-06-09 NOTE — Telephone Encounter (Signed)
Pt called stating that the MS Society is helping him get a cooling vest but they are needing a letter signed by the provider that states the pts name and diagnosis. Pt states that it needs to be faxed to Christin Bach at [phone]. ?

## 2021-06-14 ENCOUNTER — Telehealth: Payer: Self-pay | Admitting: Neurology

## 2021-06-14 NOTE — Telephone Encounter (Signed)
Raliegh Ip [facility]) September did surgery on the patient. Pt having muscle spasms, which is hindering progress with physical therapy. Want to see if Dr. Felecia Shelling has any recommendation because pt has MS. Would like call from the nurse. ?

## 2021-06-15 DIAGNOSIS — F411 Generalized anxiety disorder: Secondary | ICD-10-CM | POA: Diagnosis not present

## 2021-06-15 DIAGNOSIS — F331 Major depressive disorder, recurrent, moderate: Secondary | ICD-10-CM | POA: Diagnosis not present

## 2021-06-15 DIAGNOSIS — F4312 Post-traumatic stress disorder, chronic: Secondary | ICD-10-CM | POA: Diagnosis not present

## 2021-06-15 NOTE — Telephone Encounter (Signed)
Called [employer] back. Spoke with Rayfield Citizen and was able to provide a verbal from Dr Epimenio Foot on what his recommendation would be. She will send that for the pt and she was appreciative for our call back.  ? ?(Per Dr Epimenio Foot: Start baclofen 10 mg #93 refills  ?1/2-1 p.o. 3 times daily.  He should start out with half pill once or twice a day and slowly increase from there. ) ?

## 2021-06-17 NOTE — Telephone Encounter (Signed)
Spoke w/ Dr. Epimenio Foot, he is recommending tizanidine 4mg , 1/2-1 tab po TID prn instead ?

## 2021-06-17 NOTE — Telephone Encounter (Signed)
Seth Lindsey at [facility] letting us know pt is allergic to baclofen 10 mg that has been ordered for the muscle spasms.  ?Muscle spasms are hindering pt rehab.  ?Requesting an alternative medication for the muscle spasms.  ?[phone] ?

## 2021-06-17 NOTE — Telephone Encounter (Signed)
Called and spoke w/ [employer]. Provided VO below. They will d/c baclofen and go with tizanidine as recommended. ?

## 2021-06-18 DIAGNOSIS — K219 Gastro-esophageal reflux disease without esophagitis: Secondary | ICD-10-CM | POA: Diagnosis not present

## 2021-06-18 DIAGNOSIS — I10 Essential (primary) hypertension: Secondary | ICD-10-CM | POA: Diagnosis not present

## 2021-06-18 DIAGNOSIS — Z720 Tobacco use: Secondary | ICD-10-CM | POA: Diagnosis not present

## 2021-06-18 DIAGNOSIS — E6609 Other obesity due to excess calories: Secondary | ICD-10-CM | POA: Diagnosis not present

## 2021-06-18 DIAGNOSIS — D751 Secondary polycythemia: Secondary | ICD-10-CM | POA: Diagnosis not present

## 2021-06-18 DIAGNOSIS — M25512 Pain in left shoulder: Secondary | ICD-10-CM | POA: Diagnosis not present

## 2021-06-18 DIAGNOSIS — G8929 Other chronic pain: Secondary | ICD-10-CM | POA: Diagnosis not present

## 2021-08-12 DIAGNOSIS — F411 Generalized anxiety disorder: Secondary | ICD-10-CM | POA: Diagnosis not present

## 2021-08-12 DIAGNOSIS — F4312 Post-traumatic stress disorder, chronic: Secondary | ICD-10-CM | POA: Diagnosis not present

## 2021-08-12 DIAGNOSIS — F331 Major depressive disorder, recurrent, moderate: Secondary | ICD-10-CM | POA: Diagnosis not present

## 2021-08-13 DIAGNOSIS — G35 Multiple sclerosis: Secondary | ICD-10-CM | POA: Diagnosis not present

## 2021-08-13 DIAGNOSIS — M25612 Stiffness of left shoulder, not elsewhere classified: Secondary | ICD-10-CM | POA: Diagnosis not present

## 2021-08-13 DIAGNOSIS — M6281 Muscle weakness (generalized): Secondary | ICD-10-CM | POA: Diagnosis not present

## 2021-08-13 DIAGNOSIS — Z96612 Presence of left artificial shoulder joint: Secondary | ICD-10-CM | POA: Diagnosis not present

## 2021-08-19 ENCOUNTER — Encounter: Payer: Self-pay | Admitting: Neurology

## 2021-08-19 ENCOUNTER — Ambulatory Visit: Payer: Medicare HMO | Admitting: Neurology

## 2021-08-19 VITALS — BP 132/85 | HR 74 | Ht 69.0 in | Wt 246.0 lb

## 2021-08-19 DIAGNOSIS — R208 Other disturbances of skin sensation: Secondary | ICD-10-CM | POA: Diagnosis not present

## 2021-08-19 DIAGNOSIS — G35 Multiple sclerosis: Secondary | ICD-10-CM | POA: Diagnosis not present

## 2021-08-19 DIAGNOSIS — R269 Unspecified abnormalities of gait and mobility: Secondary | ICD-10-CM | POA: Diagnosis not present

## 2021-08-19 DIAGNOSIS — R252 Cramp and spasm: Secondary | ICD-10-CM | POA: Diagnosis not present

## 2021-08-19 DIAGNOSIS — Z79899 Other long term (current) drug therapy: Secondary | ICD-10-CM | POA: Diagnosis not present

## 2021-08-19 MED ORDER — TIZANIDINE HCL 4 MG PO TABS
4.0000 mg | ORAL_TABLET | Freq: Four times a day (QID) | ORAL | 11 refills | Status: DC
Start: 2021-08-19 — End: 2022-09-06

## 2021-08-19 MED ORDER — LAMOTRIGINE 100 MG PO TABS: ORAL_TABLET | ORAL | 3 refills | Status: DC

## 2021-08-19 NOTE — Patient Instructions (Addendum)
The pharmacy has the prescription for lamotrigine 100 mg tablets. For 5 days, just take one half pill a day. For the next 5 days, take one half pill twice a day. For the next 5 days, take one half pill in the morning and one pill at night Then start taking one pill twice a day from this point on.      In the future, we may increase the dose further.  If you get a rash, need to stop the medication and not take it again.    Cut gabapentin down to 2 pills 3 times a day.
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
## 2021-08-20 LAB — CBC WITH DIFFERENTIAL/PLATELET
Basophils Absolute: 0.1 10*3/uL (ref 0.0–0.2)
Basos: 1 %
EOS (ABSOLUTE): 0.1 10*3/uL (ref 0.0–0.4)
Eos: 1 %
Hematocrit: 48.3 % (ref 37.5–51.0)
Hemoglobin: 16.9 g/dL (ref 13.0–17.7)
Immature Grans (Abs): 0 10*3/uL (ref 0.0–0.1)
Immature Granulocytes: 1 %
Lymphocytes Absolute: 2.1 10*3/uL (ref 0.7–3.1)
Lymphs: 24 %
MCH: 31.6 pg (ref 26.6–33.0)
MCHC: 35 g/dL (ref 31.5–35.7)
MCV: 90 fL (ref 79–97)
Monocytes Absolute: 0.8 10*3/uL (ref 0.1–0.9)
Monocytes: 9 %
Neutrophils Absolute: 5.7 10*3/uL (ref 1.4–7.0)
Neutrophils: 64 %
Platelets: 259 10*3/uL (ref 150–450)
RBC: 5.34 x10E6/uL (ref 4.14–5.80)
RDW: 12.5 % (ref 11.6–15.4)
WBC: 8.8 10*3/uL (ref 3.4–10.8)

## 2021-08-20 LAB — IGG, IGA, IGM
IgA/Immunoglobulin A, Serum: 202 mg/dL (ref 90–386)
IgG (Immunoglobin G), Serum: 1012 mg/dL (ref 603–1613)
IgM (Immunoglobulin M), Srm: 74 mg/dL (ref 20–172)

## 2021-08-25 DIAGNOSIS — Z96612 Presence of left artificial shoulder joint: Secondary | ICD-10-CM | POA: Diagnosis not present

## 2021-08-25 DIAGNOSIS — G35 Multiple sclerosis: Secondary | ICD-10-CM | POA: Diagnosis not present

## 2021-08-25 DIAGNOSIS — M25612 Stiffness of left shoulder, not elsewhere classified: Secondary | ICD-10-CM | POA: Diagnosis not present

## 2021-08-25 DIAGNOSIS — M6281 Muscle weakness (generalized): Secondary | ICD-10-CM | POA: Diagnosis not present

## 2021-08-26 ENCOUNTER — Ambulatory Visit (INDEPENDENT_AMBULATORY_CARE_PROVIDER_SITE_OTHER): Payer: Medicare HMO | Admitting: Family Medicine

## 2021-08-26 ENCOUNTER — Encounter: Payer: Self-pay | Admitting: Family Medicine

## 2021-08-26 VITALS — BP 105/65 | HR 79 | Ht 69.0 in | Wt 244.0 lb

## 2021-08-26 DIAGNOSIS — F418 Other specified anxiety disorders: Secondary | ICD-10-CM | POA: Diagnosis not present

## 2021-08-26 DIAGNOSIS — I10 Essential (primary) hypertension: Secondary | ICD-10-CM | POA: Diagnosis not present

## 2021-08-26 DIAGNOSIS — K219 Gastro-esophageal reflux disease without esophagitis: Secondary | ICD-10-CM | POA: Diagnosis not present

## 2021-08-26 DIAGNOSIS — R252 Cramp and spasm: Secondary | ICD-10-CM

## 2021-08-26 DIAGNOSIS — G35 Multiple sclerosis: Secondary | ICD-10-CM

## 2021-08-26 DIAGNOSIS — Z720 Tobacco use: Secondary | ICD-10-CM | POA: Insufficient documentation

## 2021-08-26 MED ORDER — AMLODIPINE-OLMESARTAN 5-20 MG PO TABS: 1.0000 | ORAL_TABLET | Freq: Every day | ORAL | 1 refills | Status: DC

## 2021-08-26 MED ORDER — OMEPRAZOLE 20 MG PO CPDR
20.0000 mg | DELAYED_RELEASE_CAPSULE | Freq: Every day | ORAL | 1 refills | Status: DC
Start: 2021-08-26 — End: 2022-03-02

## 2021-08-26 NOTE — Patient Instructions (Signed)
Thank you for choosing [facility] for your Primary Care needs. I am excited for the opportunity to partner with you to meet your health care goals. It was a pleasure meeting you today!   Information on diet, exercise, and health maintenance recommendations are listed below. This is information to help you be sure you are on track for optimal health and monitoring.   Please look over this and let us know if you have any questions or if you have completed any of the health maintenance outside of [facility] so that we can be sure your records are up to date.  ___________________________________________________________  MyChart:  For all urgent or time sensitive needs we ask that you please call the office to avoid delays. Our number is [phone]. MyChart is not constantly monitored and due to the large volume of messages a day, replies may take up to 72 business hours.  MyChart Policy: MyChart allows for you to see your visit notes, after visit summary, provider recommendations, lab and tests results, make an appointment, request refills, and contact your provider or the office for non-urgent questions or concerns. Providers are seeing patients during normal business hours and do not have built in time to review MyChart messages.  We ask that you allow a minimum of 3 business days for responses to MyChart messages. For this reason, please do not send urgent requests through MyChart. Please call the office at [phone]. New and ongoing conditions may require a visit. We have virtual and in-person visits available for your convenience.  Complex MyChart concerns may require a visit. Your provider may request you schedule a virtual or in-person visit to ensure we are providing the best care possible. MyChart messages sent after 11:00 AM on Friday will not be received by the provider until Monday morning.    Lab and Test Results: You will receive your lab and  test results on MyChart as soon as they are completed and results have been sent by the lab or testing facility. Due to this service, you will receive your results BEFORE your provider.  I review lab and test results each morning prior to seeing patients. Some results require collaboration with other providers to ensure you are receiving the most appropriate care. For this reason, we ask that you please allow a minimum of 3-5 business days from the time that ALL results have been received for your provider to receive and review lab and test results and contact you about these.  Most lab and test result comments from the provider will be sent through MyChart. Your provider may recommend changes to the plan of care, follow-up visits, repeat testing, ask questions, or request an office visit to discuss these results. You may reply directly to this message or call the office to provide information for the provider or set up an appointment. In some instances, you will be called with test results and recommendations. Please let us know if this is preferred and we will make note of this in your chart to provide this for you.    If you have not heard a response to your lab or test results in 5 business days from all results returning to MyChart, please call the office to let us know. We ask that you please avoid calling prior to this time unless there is an emergent concern. Due to high call volumes, this can delay the resulting process.  After Hours: For all non-emergency after hours needs,   please call the office at [phone] and select the option to reach the on-call  service. On-call services are shared between multiple [facility] offices and therefore it will not be possible to speak directly with your provider. On-call providers may provide medical advice and recommendations, but are unable to provide refills for maintenance medications.  For all emergency or urgent medical needs after normal business  hours, we recommend that you seek care at the closest Urgent Care or Emergency Department to ensure appropriate treatment in a timely manner.  [facility] has a 24 hour emergency room located on the ground floor for your convenience.   Urgent Concerns During the Business Day Providers are seeing patients from 8AM to 5PM with a busy schedule and are most often not able to respond to non-urgent calls until the end of the day or the next business day. If you should have URGENT concerns during the day, please call and speak to the nurse or schedule a same day appointment so that we can address your concern without delay.   Thank you, again, for choosing me as your health care partner. I appreciate your trust and look forward to learning more about you.   Haakon Titsworth B. Laasia Arcos, DNP, FNP-C  ___________________________________________________________  Health Maintenance Recommendations Screening Testing Mammogram Every 1-2 years based on history and risk factors Starting at age 50 Pap Smear Ages 21-39 every 3 years Ages 30-65 every 5 years with HPV testing More frequent testing may be required based on results and history Colon Cancer Screening Every 1-10 years based on test performed, risk factors, and history Starting at age 45 Bone Density Screening Every 2-10 years based on history Starting at age 65 for women Recommendations for men differ based on medication usage, history, and risk factors AAA Screening One time ultrasound Men 65-75 years old who have ever smoked Lung Cancer Screening Low Dose Lung CT every 12 months Age 50-80 years with a 20 pack-year smoking history who still smoke or who have quit within the last 15 years  Screening Labs Routine  Labs: Complete Blood Count (CBC), Complete Metabolic Panel (CMP), Cholesterol (Lipid Panel) Every 6-12 months based on history and medications May be recommended more frequently based on current conditions or  previous results Hemoglobin A1c Lab Every 3-12 months based on history and previous results Starting at age 45 or earlier with diagnosis of diabetes, high cholesterol, BMI >26, and/or risk factors Frequent monitoring for patients with diabetes to ensure blood sugar control Thyroid Panel (TSH w/ T3 & T4) Every 6 months based on history, symptoms, and risk factors May be repeated more often if on medication HIV One time testing for all patients 13 and older May be repeated more frequently for patients with increased risk factors or exposure Hepatitis C One time testing for all patients 18 and older May be repeated more frequently for patients with increased risk factors or exposure Gonorrhea, Chlamydia Every 12 months for all sexually active persons 13-24 years Additional monitoring may be recommended for those who are considered high risk or who have symptoms PSA Men 40-54 years old with risk factors Additional screening may be recommended from age 55-69 based on risk factors, symptoms, and history  Vaccine Recommendations Tetanus Booster All adults every 10 years Flu Vaccine All patients 6 months and older every year COVID Vaccine All patients 12 years and older Initial dosing with booster May recommend additional booster based on age and health history HPV Vaccine 2 doses all patients   age 9-26 Dosing may be considered for patients over 26 Shingles Vaccine (Shingrix) 2 doses all adults 50 years and older Pneumonia (Pneumovax 23) All adults 65 years and older May recommend earlier dosing based on health history Pneumonia (Prevnar 13) All adults 65 years and older Dosed 1 year after Pneumovax 23 Pneumonia (Prevnar 20) All adults 65 years and older (adults 19-64 with certain conditions or risk factors) 1 dose  For those who have no received Prevnar 13 vaccine previously   Additional Screening, Testing, and Vaccinations may be recommended on an individualized basis based on  family history, health history, risk factors, and/or exposure.  __________________________________________________________  Diet Recommendations for All Patients  I recommend that all patients maintain a diet low in saturated fats, carbohydrates, and cholesterol. While this can be challenging at first, it is not impossible and small changes can make big differences.  Things to try: Decreasing the amount of soda, sweet tea, and/or juice to one or less per day and replace with water While water is always the first choice, if you do not like water you may consider adding a water additive without sugar to improve the taste other sugar free drinks Replace potatoes with a brightly colored vegetable at dinner Use healthy oils, such as canola oil or olive oil, instead of butter or hard margarine Limit your bread intake to two pieces or less a day Replace regular pasta with low carb pasta options Bake, broil, or grill foods instead of frying Monitor portion sizes  Eat smaller, more frequent meals throughout the day instead of large meals  An important thing to remember is, if you love foods that are not great for your health, you don't have to give them up completely. Instead, allow these foods to be a reward when you have done well. Allowing yourself to still have special treats every once in a while is a nice way to tell yourself thank you for working hard to keep yourself healthy.   Also remember that every day is a new day. If you have a bad day and "fall off the wagon", you can still climb right back up and keep moving along on your journey!  We have resources available to help you!  Some websites that may be helpful include: www.MyPlate.gov  Www.VeryWellFit.com _____________________________________________________________  Activity Recommendations for All Patients  I recommend that all adults get at least 20 minutes of moderate physical activity that elevates your heart rate at least 5  days out of the week.  Some examples include: Walking or jogging at a pace that allows you to carry on a conversation Cycling (stationary bike or outdoors) Water aerobics Yoga Weight lifting Dancing If physical limitations prevent you from putting stress on your joints, exercise in a pool or seated in a chair are excellent options.  Do determine your MAXIMUM heart rate for activity: 220 - YOUR AGE = MAX Heart Rate   Remember! Do not push yourself too hard.  Start slowly and build up your pace, speed, weight, time in exercise, etc.  Allow your body to rest between exercise and get good sleep. You will need more water than normal when you are exerting yourself. Do not wait until you are thirsty to drink. Drink with a purpose of getting in at least 8, 8 ounce glasses of water a day plus more depending on how much you exercise and sweat.    If you begin to develop dizziness, chest pain, abdominal pain, jaw pain, shortness of breath, headache,   vision changes, lightheadedness, or other concerning symptoms, stop the activity and allow your body to rest. If your symptoms are severe, seek emergency evaluation immediately. If your symptoms are concerning, but not severe, please let us know so that we can recommend further evaluation.     

## 2021-08-26 NOTE — Assessment & Plan Note (Signed)
Aware of risks. Not interested in quitting. Agreeable to lung cancer screening.

## 2021-08-26 NOTE — Assessment & Plan Note (Signed)
Continue following with neurology.

## 2021-08-26 NOTE — Assessment & Plan Note (Signed)
Blood pressure is at goal for age and co-morbidities.  I recommend continue amlodipine-olmesartan.   - BP goal <130/80 - monitor and log blood pressures at home - check around the same time each day in a relaxed setting - Limit salt to <2000 mg/day - Follow DASH eating plan (heart healthy diet) - limit alcohol to 2 standard drinks per day for men and 1 per day for women - avoid tobacco products - get at least 2 hours of regular aerobic exercise weekly Patient aware of signs/symptoms requiring further/urgent evaluation. Labs updated today.  

## 2021-08-26 NOTE — Assessment & Plan Note (Signed)
MS related. He would like B12 and Mag checked as well.

## 2021-08-26 NOTE — Assessment & Plan Note (Signed)
Stable on current regimen. No SI/HI. 

## 2021-08-26 NOTE — Assessment & Plan Note (Signed)
Controlled with Prilosec and lifestyle measures.  

## 2021-08-26 NOTE — Progress Notes (Signed)
New Patient Office Visit  Subjective    Patient ID: Seth Lindsey, male    DOB: 09-18-1965  Age: 56 y.o. MRN: [id number]  CC: Establish care  HPI Seth Lindsey presents to establish care.    Multiple sclerosis (dx in 1997): - Follows with neurology ([facility]). Saw them 08/19/21. Goes every 6 months. - Medications: Kesimpta monthly injections, tizanidine, gabapentin (starting to wean off because not effective), sertraline, risperdal, requip, lamictal (new as they start weaning off gabapentin), doxepin - S&S: left hand spasticity, cognitive issues (trouble with conversations at times), gait impairment, left leg weakness, falls, leg spasticity, fatigue,     HYPERTENSION: - Medications: amlodipine-olmesartan 5-20 mg daily - Compliance: good - Checking BP at home: occasionally, well controlled  - Denies any SOB, recurrent headaches, CP, vision changes, LE edema, dizziness, palpitations, or medication side effects.   Anxiety/Depression/Insomina: - Medications: sertraline  - Stable overall. No SI/HI  Smoker: - no intentions of quitting, aware of risks associated with smoking - 24 years, 2 packs per day       Outpatient Encounter Medications as of 08/26/2021  Medication Sig   doxepin (SINEQUAN) 10 MG capsule Take 1 capsule (10 mg total) by mouth at bedtime.   gabapentin (NEURONTIN) 300 MG capsule Take 3 capsules (900 mg total) by mouth 3 (three) times daily.   lamoTRIgine (LAMICTAL) 100 MG tablet Take as directed and then take 1 po bid   Ofatumumab (KESIMPTA) 20 MG/0.4ML SOAJ Inject 20 mg into the skin every 30 (thirty) days.   PRAZOSIN HCL PO Take by mouth.   risperiDONE (RISPERDAL) 0.25 MG tablet TAKE 2 TABLETS AT BEDTIME   rOPINIRole (REQUIP) 0.5 MG tablet TAKE 1 TABLET THREE TIMES DAILY   sertraline (ZOLOFT) 100 MG tablet Take 100 mg by mouth daily.   tiZANidine (ZANAFLEX) 4 MG tablet Take 1 tablet (4 mg total) by mouth in the morning, at noon, in the evening, and at bedtime.    [DISCONTINUED] amLODipine-olmesartan (AZOR) 5-20 MG tablet Take 1 tablet by mouth daily.   amLODipine-olmesartan (AZOR) 5-20 MG tablet Take 1 tablet by mouth daily.   omeprazole (PRILOSEC) 20 MG capsule Take 1 capsule (20 mg total) by mouth daily.   [DISCONTINUED] omeprazole (PRILOSEC) 20 MG capsule Take 20 mg by mouth daily.   No facility-administered encounter medications on file as of 08/26/2021.    Past Medical History:  Diagnosis Date   Allergy    Anxiety    Depression    GERD (gastroesophageal reflux disease)    Hypertension    Multiple sclerosis (HCC)     Past Surgical History:  Procedure Laterality Date   APPENDECTOMY     arm surgery     left- biceps reattached    CERVICAL FUSION     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   Procedure: REVERSE SHOULDER ARTHROPLASTY;  Surgeon: Bjorn Pippin, MD;  Location: [facility];  Service: Orthopedics;  Laterality: Left;    Family History  Problem Relation Age of Onset   Early death Mother    Aneurysm Mother    Heart attack Mother    Early death Father    Alcohol abuse Father    Stomach cancer Father    Early death Sister    Macular degeneration Sister    Early death Daughter    Heart attack Maternal Grandfather    Early death Maternal Grandfather    Cancer Paternal Grandfather    Early death Paternal Grandfather     Social History  Socioeconomic History   Marital status: Significant Other    Spouse name: Not on file   Number of children: Not on file   Years of education: 12   Highest education level: Not on file  Occupational History   Occupation: Disabled  Tobacco Use   Smoking status: Every Day    Packs/day: 1.00    Types: Cigarettes   Smokeless tobacco: Never  Substance and Sexual Activity   Alcohol use: Not Currently    Comment: very rarely   Drug use: Never   Sexual activity: Not Currently  Other Topics Concern   Not on file  Social History Narrative   Right handed    Lives  with Seth Lindsey (fiance)   Caffeine use: 1 pot coffee/day, 1 coke per day   Social Determinants of Health   Financial Resource Strain: Not on file  Food Insecurity: Not on file  Transportation Needs: Not on file  Physical Activity: Not on file  Stress: Not on file  Social Connections: Not on file  Intimate Partner Violence: Not on file    ROS All review of systems negative except what is listed in the HPI      Objective    BP 105/65   Pulse 79   Ht 5\' 9"  (1.753 m)   Wt 244 lb (110.7 kg)   BMI 36.03 kg/m   Physical Exam Vitals reviewed.  Constitutional:      Appearance: Normal appearance.  Cardiovascular:     Rate and Rhythm: Normal rate and regular rhythm.  Pulmonary:     Effort: Pulmonary effort is normal.     Breath sounds: Normal breath sounds.  Neurological:     Mental Status: He is alert. Mental status is at baseline.     Sensory: Sensory deficit present.     Motor: Weakness present.     Gait: Gait abnormal.  Psychiatric:        Mood and Affect: Mood normal.        Behavior: Behavior normal.        Thought Content: Thought content normal.        Judgment: Judgment normal.         Assessment & Plan:   Problem List Items Addressed This Visit       Cardiovascular and Mediastinum   Primary hypertension    Blood pressure is at goal for age and co-morbidities.  I recommend continue amlodipine-olmesartan.   - BP goal <130/80 - monitor and log blood pressures at home - check around the same time each day in a relaxed setting - Limit salt to <2000 mg/day - Follow DASH eating plan (heart healthy diet) - limit alcohol to 2 standard drinks per day for men and 1 per day for women - avoid tobacco products - get at least 2 hours of regular aerobic exercise weekly Patient aware of signs/symptoms requiring further/urgent evaluation. Labs updated today.       Relevant Medications   amLODipine-olmesartan (AZOR) 5-20 MG tablet   Other Relevant Orders   CBC    Comprehensive metabolic panel   Lipid panel   TSH   Magnesium     Digestive   Gastroesophageal reflux disease    Controlled with Prilosec and lifestyle measures.       Relevant Medications   omeprazole (PRILOSEC) 20 MG capsule     Nervous and Auditory   Multiple sclerosis (HCC) - Primary    Continue following with neurology.  Other   Spasticity    MS related. He would like B12 and Mag checked as well.       Relevant Orders   Vitamin B12   Magnesium   Depression with anxiety    Stable on current regimen. No SI/HI.      Tobacco abuse    Aware of risks. Not interested in quitting. Agreeable to lung cancer screening.       Relevant Orders   Ambulatory Referral Lung Cancer Screening [facility]    Return in about 6 months (around 02/25/2022) for CPE with labs, routine f/u .   Clayborne Dana, NP

## 2021-08-27 LAB — TSH: TSH: 3.15 u[IU]/mL (ref 0.35–5.50)

## 2021-08-27 LAB — LIPID PANEL
Cholesterol: 168 mg/dL (ref 0–200)
HDL: 35.3 mg/dL — ABNORMAL LOW (ref >39.00)
LDL Cholesterol: 100 mg/dL — ABNORMAL HIGH (ref 0–99)
NonHDL: 132.67
Total CHOL/HDL Ratio: 5
Triglycerides: 163 mg/dL — ABNORMAL HIGH (ref 0.0–149.0)
VLDL: 32.6 mg/dL (ref 0.0–40.0)

## 2021-08-27 LAB — COMPREHENSIVE METABOLIC PANEL
ALT: 16 U/L (ref 0–53)
AST: 13 U/L (ref 0–37)
Albumin: 4.5 g/dL (ref 3.5–5.2)
Alkaline Phosphatase: 74 U/L (ref 39–117)
BUN: 22 mg/dL (ref 6–23)
CO2: 27 mEq/L (ref 19–32)
Calcium: 9.6 mg/dL (ref 8.4–10.5)
Chloride: 101 mEq/L (ref 96–112)
Creatinine, Ser: 1.24 mg/dL (ref 0.40–1.50)
GFR: 65.38 mL/min (ref >60.00)
Glucose, Bld: 134 mg/dL — ABNORMAL HIGH (ref 70–99)
Potassium: 4.3 mEq/L (ref 3.5–5.1)
Sodium: 137 mEq/L (ref 135–145)
Total Bilirubin: 0.6 mg/dL (ref 0.2–1.2)
Total Protein: 6.9 g/dL (ref 6.0–8.3)

## 2021-08-27 LAB — CBC
HCT: 46.1 % (ref 39.0–52.0)
Hemoglobin: 15.5 g/dL (ref 13.0–17.0)
MCHC: 33.7 g/dL (ref 30.0–36.0)
MCV: 93 fl (ref 78.0–100.0)
Platelets: 266 10*3/uL (ref 150.0–400.0)
RBC: 4.95 Mil/uL (ref 4.22–5.81)
RDW: 13.8 % (ref 11.5–15.5)
WBC: 7.6 10*3/uL (ref 4.0–10.5)

## 2021-08-27 LAB — VITAMIN B12: Vitamin B-12: 252 pg/mL (ref 211–911)

## 2021-08-27 LAB — MAGNESIUM: Magnesium: 2 mg/dL (ref 1.5–2.5)

## 2021-09-03 DIAGNOSIS — M25612 Stiffness of left shoulder, not elsewhere classified: Secondary | ICD-10-CM | POA: Diagnosis not present

## 2021-09-03 DIAGNOSIS — G35 Multiple sclerosis: Secondary | ICD-10-CM | POA: Diagnosis not present

## 2021-09-03 DIAGNOSIS — M6281 Muscle weakness (generalized): Secondary | ICD-10-CM | POA: Diagnosis not present

## 2021-09-03 DIAGNOSIS — Z96612 Presence of left artificial shoulder joint: Secondary | ICD-10-CM | POA: Diagnosis not present

## 2021-09-06 ENCOUNTER — Telehealth: Payer: Self-pay | Admitting: Neurology

## 2021-09-06 DIAGNOSIS — R269 Unspecified abnormalities of gait and mobility: Secondary | ICD-10-CM

## 2021-09-06 DIAGNOSIS — R208 Other disturbances of skin sensation: Secondary | ICD-10-CM

## 2021-09-06 DIAGNOSIS — R252 Cramp and spasm: Secondary | ICD-10-CM

## 2021-09-06 DIAGNOSIS — S4992XS Unspecified injury of left shoulder and upper arm, sequela: Secondary | ICD-10-CM

## 2021-09-06 DIAGNOSIS — G35 Multiple sclerosis: Secondary | ICD-10-CM

## 2021-09-06 NOTE — Telephone Encounter (Signed)
Called pt. He states he goes to PT d/t previous shoulder replacement/MS. Dr. Epimenio Foot spoke w/ ortho d/t severe spasms in arms and placed on tizanidine. PT recommended he be referred to MS nerve clinic. Neuro health clinic at [facility].  He did not have any specifics. Advised we can refer to below location if he would like. He agreed. I placed referral and provided him their phone number. Aware they will reach out to schedule.  [facility] Address: [address], [place], [place] [address] Phone: [phone]

## 2021-09-06 NOTE — Telephone Encounter (Signed)
Pt is calling and requesting a referral to a MS Nerve Clinic per his physical therapist. Pt would like a call from the nurse to discuss this matter.

## 2021-09-08 ENCOUNTER — Ambulatory Visit: Payer: Medicare HMO

## 2021-09-17 ENCOUNTER — Ambulatory Visit: Payer: Medicare HMO | Attending: Neurology

## 2021-09-17 DIAGNOSIS — R2689 Other abnormalities of gait and mobility: Secondary | ICD-10-CM | POA: Insufficient documentation

## 2021-09-17 DIAGNOSIS — S4992XS Unspecified injury of left shoulder and upper arm, sequela: Secondary | ICD-10-CM | POA: Diagnosis not present

## 2021-09-17 DIAGNOSIS — R269 Unspecified abnormalities of gait and mobility: Secondary | ICD-10-CM | POA: Diagnosis not present

## 2021-09-17 DIAGNOSIS — M6281 Muscle weakness (generalized): Secondary | ICD-10-CM | POA: Insufficient documentation

## 2021-09-17 DIAGNOSIS — R208 Other disturbances of skin sensation: Secondary | ICD-10-CM | POA: Diagnosis not present

## 2021-09-17 DIAGNOSIS — R252 Cramp and spasm: Secondary | ICD-10-CM | POA: Diagnosis not present

## 2021-09-17 DIAGNOSIS — G35 Multiple sclerosis: Secondary | ICD-10-CM | POA: Diagnosis not present
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
## 2021-09-20 ENCOUNTER — Other Ambulatory Visit: Payer: Self-pay | Admitting: Neurology

## 2021-09-20 DIAGNOSIS — M25512 Pain in left shoulder: Secondary | ICD-10-CM

## 2021-09-22 ENCOUNTER — Encounter: Payer: Self-pay | Admitting: Physical Therapy

## 2021-09-22 ENCOUNTER — Ambulatory Visit: Payer: Medicare HMO | Admitting: Physical Therapy

## 2021-09-22 DIAGNOSIS — R252 Cramp and spasm: Secondary | ICD-10-CM | POA: Diagnosis not present

## 2021-09-22 DIAGNOSIS — R2689 Other abnormalities of gait and mobility: Secondary | ICD-10-CM | POA: Diagnosis not present

## 2021-09-22 DIAGNOSIS — R269 Unspecified abnormalities of gait and mobility: Secondary | ICD-10-CM | POA: Diagnosis not present

## 2021-09-22 DIAGNOSIS — S4992XS Unspecified injury of left shoulder and upper arm, sequela: Secondary | ICD-10-CM | POA: Diagnosis not present

## 2021-09-22 DIAGNOSIS — M6281 Muscle weakness (generalized): Secondary | ICD-10-CM

## 2021-09-22 DIAGNOSIS — G35 Multiple sclerosis: Secondary | ICD-10-CM | POA: Diagnosis not present

## 2021-09-22 DIAGNOSIS — R208 Other disturbances of skin sensation: Secondary | ICD-10-CM | POA: Diagnosis not present

## 2021-09-22 NOTE — Therapy (Signed)
OUTPATIENT PHYSICAL THERAPY NEURO TREATMENT   Patient Name: Franklin Baumbach MRN: [id number] DOB:03/07/1965, 56 y.o., male Today's Date: 09/22/2021   PCP: Hyman Hopes, NP REFERRING PROVIDER: Asa Lente, MD    PT End of Session - 09/22/21 1138     Visit Number 2    Number of Visits 11    Date for PT Re-Evaluation 10/29/21    Authorization Type [employer]    PT Start Time 1135    PT Stop Time 1230    PT Time Calculation (min) 55 min    Equipment Utilized During Treatment Gait belt    Activity Tolerance Patient tolerated treatment well    Behavior During Therapy WFL for tasks assessed/performed              Past Medical History:  Diagnosis Date   Allergy    Anxiety    Depression    GERD (gastroesophageal reflux disease)    Hypertension    Multiple sclerosis (HCC)    Past Surgical History:  Procedure Laterality Date   APPENDECTOMY     arm surgery     left- biceps reattached    CERVICAL FUSION     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   Procedure: REVERSE SHOULDER ARTHROPLASTY;  Surgeon: Bjorn Pippin, MD;  Location: [facility];  Service: Orthopedics;  Laterality: Left;   Patient Active Problem List   Diagnosis Date Noted   Primary hypertension 08/26/2021   Gastroesophageal reflux disease 08/26/2021   Tobacco abuse 08/26/2021   Insomnia 02/04/2021   Depression with anxiety 02/04/2021   High risk medication use 08/03/2020   Multiple sclerosis (HCC) 03/19/2020   Gait disturbance 03/19/2020   Spasticity 03/19/2020   Injury of left upper arm 03/19/2020    ONSET DATE: 09/06/21 date of referral  REFERRING DIAG: G35 (ICD-10-CM) - Multiple sclerosis (HCC) R25.2 (ICD-10-CM) - Spasticity R26.9 (ICD-10-CM) - Gait disturbance R20.8 (ICD-10-CM) - Dysesthesia S49.92XS (ICD-10-CM) - Injury of left upper arm, sequela   THERAPY DIAG:  Muscle weakness (generalized)  Other abnormalities of gait and mobility  Rationale for Evaluation and  Treatment Rehabilitation  SUBJECTIVE:                                                                                                                                                                                              SUBJECTIVE STATEMENT: Pt reports today has not been a good day for him as he is in 10/10 pain. His spouse endorses frequent falls and/or near falls reported as "jolts" or "stumbles" in the home environment. Pt asking if he can walk outside by himself with  his upright walker.  Pt accompanied by:  Spouse  PERTINENT HISTORY: MS, L shoulder replacement  PAIN:  Are you having pain? Yes: NPRS scale: 10/10 Pain location: everywhere Pain description: numbness with shooting and electrical pain, painful, throbbing Aggravating factors: lifting L arm, carrying Relieving factors: "sun"  PRECAUTIONS: Fall  WEIGHT BEARING RESTRICTIONS No  FALLS: Has patient fallen in last 6 months? Yes. Number of falls 2x/week and multiple near falls per day  LIVING ENVIRONMENT: Lives with: lives with their spouse Lives in: House/apartment Has following equipment at home: Single point cane and Up walker  PLOF: Requires assistive device for independence and Needs assistance with homemaking  PATIENT GOALS Reduce falls, improve pain in my shoulder  OBJECTIVE:    COGNITION: Overall cognitive status: Within functional limits for tasks assessed  TODAY'S TREATMENT:   GAIT: Gait pattern: Intermittent foot inversion R and L during stance phase, decreased arm swing- Right, decreased arm swing- Left, decreased step length- Right, decreased step length- Left, decreased stance time- Right, decreased stance time- Left, decreased stride length, antalgic, wide BOS, poor foot clearance- Right, and poor foot clearance- Left Distance walked: various clinic distances Assistive device utilized: Single point cane Level of assistance: CGA Comments: decreased safety with turns  Stairs: pt able to  perform one 6" step-tap with R handrail and min A for balance, unsafe to attempt stair navigation this session   THER EX: See HEP below  SELF-CARE/HOME MANAGEMENT: Discussed safety with gait and transfers in the home with patient and his spouse. Pt questioning if he can walk outdoors in his parking lot with his [facility] or with his upright walker by himself. Educated pt that he is not safe to ambulate independently at this time and the recommendation would be that he use his SPC indoors due to space constraints and that he have his spouse with him when ambulating. Discussed pt bringing in his upright walker next session to assess his safety with this device. Pt also limited by LUE pain/weakness/decreased ROM so unsure if he would be able to safely use the upright walker at this time. Do not recommend patient walk outdoors across uneven ground with either device at this time until further assessed due to his very high fall risk. Also educated pt and his wife on the progressive nature of MS and that therapy can be beneficial to improve strength, balance, coordination, etc. and slow decline in over function but ultimately this is a progressive condition.  THER ACT:  [facility] PT Assessment - 09/22/21 1143       Standardized Balance Assessment   Standardized Balance Assessment Berg Balance Test      Berg Balance Test   Sit to Stand Able to stand using hands after several tries    Standing Unsupported Unable to stand 30 seconds unassisted    Sitting with Back Unsupported but Feet Supported on Floor or Stool Able to sit safely and securely 2 minutes    Stand to Sit Controls descent by using hands    Transfers Needs one person to assist    Standing Unsupported with Eyes Closed Needs help to keep from falling    Standing Unsupported with Feet Together Needs help to attain position and unable to hold for 15 seconds    From Standing, Reach Forward with Outstretched Arm Loses balance while trying/requires external  support    From Standing Position, Pick up Object from Floor Unable to try/needs assist to keep balance    From Standing Position, Turn to  Look Behind Over each Shoulder Needs supervision when turning    Turn 360 Degrees Needs assistance while turning    Standing Unsupported, Alternately Place Feet on Step/Stool Needs assistance to keep from falling or unable to try    Standing Unsupported, One Foot in [employer] balance while stepping or standing    Standing on One Leg Unable to try or needs assist to prevent fall    Total Score 11             PATIENT EDUCATION: Education details: initiated HEP, results of BBS and functional implications, safety with gait and transfers in the home, progressive nature of MS Person educated: Patient, Spouse Education method: Explanation, Handout Education comprehension: verbalized understanding   HOME EXERCISE PROGRAM: Access Code: [email or web address] URL: [email or web address] Date: 09/22/2021 Prepared by: Excell Seltzer  Exercises - Sit to Stand with Armchair  - 1 x daily - 7 x weekly - 3 sets - 10 reps - Standing Balance with Eyes Open  - 1 x daily - 7 x weekly - 3 sets - 10 reps - Seated Modified Small Range Crunch in Chair  - 1 x daily - 7 x weekly - 3 sets - 10 reps   GOALS: Goals reviewed with patient? Yes  SHORT TERM GOALS: Target date: 10/15/2021  Patient will be able to ambulate 230' with st. Cane without evidence of loss of balance to improve functional ambulation and reduce fall risk. Baseline:115' with 4 instances of LOB Goal status: INITIAL  2.  Patient will be able to use his L UE with functional transfers and also demo improved control with eccentric stand to sit to improve safe transfers. Baseline: Pt plops >90% of the with sitting Goal status: INITIAL  3.  Pt will be able to go up and down 8 stairs with use of rail/s and SBA to improve community accesss Baseline: TBD Goal status: INITIAL   LONG TERM GOALS:  Target date: 11/12/2021  Pt will demo 15 sec improvement in TUG score to improve functional mobility with appropriate AD Baseline: 54 sec with st. cane Goal status: INITIAL  2.  Patient will demo 0.50 m/s or better gait speed with appropriate AD to reduce fall risk and improve community abulation Baseline: 0.32 m/s with st. cane Goal status: INITIAL  3.  Patient will demo 5 x sit to stand with bil UE support in <40 seconds to improve functional strength and safe transfers. Baseline: 53 seconds with  R UE support Goal status: INITIAL  4.  Patient will demo 6 points of improvement in BBS to improve balance and reduce fall risk Baseline: TBD Goal status: INITIAL  ASSESSMENT:  CLINICAL IMPRESSION: Emphasis of skilled PT session on further assessing balance with BBS, initiating HEP, and educating pt and his spouse on progressive nature of MS. Pt scores 11/56 on the BBS, indicating a high fall risk. Educated pt and spouse on results of test and functional implications. Initiated HEP for balance and strengthening, see above. Pt requires frequent cues to redirect attention to therapy tasks as he is hyperverbal during session. Stairs determined to be unsafe to assess at this time. Continue POC.   OBJECTIVE IMPAIRMENTS Abnormal gait, decreased activity tolerance, decreased balance, decreased endurance, decreased mobility, difficulty walking, decreased ROM, decreased strength, decreased safety awareness, increased fascial restrictions, impaired perceived functional ability, impaired flexibility, impaired sensation, impaired tone, impaired UE functional use, improper body mechanics, postural dysfunction, and pain.   ACTIVITY LIMITATIONS carrying, lifting, bending, sitting, standing, squatting, stairs, transfers, bed  mobility, bathing, toileting, dressing, hygiene/grooming, and caring for others  PARTICIPATION LIMITATIONS: meal prep, cleaning, laundry, driving, shopping, community activity, and yard  work  PERSONAL FACTORS Past/current experiences, Time since onset of injury/illness/exacerbation, and 1-2 comorbidities: MS, L shoulder replacement  are also affecting patient's functional outcome.   REHAB POTENTIAL: Good  CLINICAL DECISION MAKING: Stable/uncomplicated  EVALUATION COMPLEXITY: Low  PLAN: PT FREQUENCY: 2x/week  PT DURATION: 8 weeks  PLANNED INTERVENTIONS: Therapeutic exercises, Therapeutic activity, Neuromuscular re-education, Balance training, Gait training, Patient/Family education, Self Care, Joint mobilization, Stair training, Orthotic/Fit training, Aquatic Therapy, Wheelchair mobility training, Spinal mobilization, Cryotherapy, Moist heat, Manual therapy, and Re-evaluation  PLAN FOR NEXT SESSION: Work on static standing balance, core strengthening, gait with LRAD (assess upright walker if pt brings to session)   Peter Congo, PT, DPT, CSRS 09/22/2021, 12:45 PM

## 2021-09-24 ENCOUNTER — Ambulatory Visit: Payer: Medicare HMO | Admitting: Physical Therapy

## 2021-09-24 ENCOUNTER — Other Ambulatory Visit: Payer: Self-pay | Admitting: Neurology

## 2021-09-29 ENCOUNTER — Ambulatory Visit: Payer: Medicare HMO | Admitting: Physical Therapy

## 2021-10-01 ENCOUNTER — Ambulatory Visit: Payer: Medicare HMO

## 2021-10-06 ENCOUNTER — Encounter: Payer: Self-pay | Admitting: Physical Therapy

## 2021-10-06 ENCOUNTER — Ambulatory Visit: Payer: Medicare HMO | Attending: Neurology | Admitting: Physical Therapy

## 2021-10-06 DIAGNOSIS — M25612 Stiffness of left shoulder, not elsewhere classified: Secondary | ICD-10-CM | POA: Diagnosis not present

## 2021-10-06 DIAGNOSIS — R41844 Frontal lobe and executive function deficit: Secondary | ICD-10-CM | POA: Insufficient documentation

## 2021-10-06 DIAGNOSIS — R2689 Other abnormalities of gait and mobility: Secondary | ICD-10-CM | POA: Diagnosis not present

## 2021-10-06 DIAGNOSIS — G8929 Other chronic pain: Secondary | ICD-10-CM | POA: Diagnosis not present

## 2021-10-06 DIAGNOSIS — M25512 Pain in left shoulder: Secondary | ICD-10-CM | POA: Insufficient documentation

## 2021-10-06 DIAGNOSIS — M6281 Muscle weakness (generalized): Secondary | ICD-10-CM | POA: Diagnosis not present

## 2021-10-06 DIAGNOSIS — M25622 Stiffness of left elbow, not elsewhere classified: Secondary | ICD-10-CM | POA: Insufficient documentation

## 2021-10-06 DIAGNOSIS — R41840 Attention and concentration deficit: Secondary | ICD-10-CM | POA: Insufficient documentation

## 2021-10-06 NOTE — Therapy (Signed)
OUTPATIENT PHYSICAL THERAPY NEURO TREATMENT   Patient Name: Seth Lindsey MRN: [id number] DOB:1965-05-16, 56 y.o., male Today's Date: 10/06/2021   PCP: Hyman Hopes, NP REFERRING PROVIDER: Asa Lente, MD    PT End of Session - 10/06/21 1024     Visit Number 3    Number of Visits 11    Date for PT Re-Evaluation 10/29/21    Authorization Type [employer]    PT Start Time 1020    PT Stop Time 1100    PT Time Calculation (min) 40 min    Equipment Utilized During Treatment --    Activity Tolerance Patient tolerated treatment well    Behavior During Therapy WFL for tasks assessed/performed               Past Medical History:  Diagnosis Date   Allergy    Anxiety    Depression    GERD (gastroesophageal reflux disease)    Hypertension    Multiple sclerosis (HCC)    Past Surgical History:  Procedure Laterality Date   APPENDECTOMY     arm surgery     left- biceps reattached    CERVICAL FUSION     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   Procedure: REVERSE SHOULDER ARTHROPLASTY;  Surgeon: Bjorn Pippin, MD;  Location: [facility];  Service: Orthopedics;  Laterality: Left;   Patient Active Problem List   Diagnosis Date Noted   Primary hypertension 08/26/2021   Gastroesophageal reflux disease 08/26/2021   Tobacco abuse 08/26/2021   Insomnia 02/04/2021   Depression with anxiety 02/04/2021   High risk medication use 08/03/2020   Multiple sclerosis (HCC) 03/19/2020   Gait disturbance 03/19/2020   Spasticity 03/19/2020   Injury of left upper arm 03/19/2020    ONSET DATE: 09/06/21 date of referral  REFERRING DIAG: G35 (ICD-10-CM) - Multiple sclerosis (HCC) R25.2 (ICD-10-CM) - Spasticity R26.9 (ICD-10-CM) - Gait disturbance R20.8 (ICD-10-CM) - Dysesthesia S49.92XS (ICD-10-CM) - Injury of left upper arm, sequela   THERAPY DIAG:  Muscle weakness (generalized)  Other abnormalities of gait and mobility  Rationale for Evaluation and Treatment  Rehabilitation  SUBJECTIVE:                                                                                                                                                                                              SUBJECTIVE STATEMENT: Pt reports his pain was really bad last week so he cancelled his PT appointments. Pain is 10/10 right now but that is an improvement from how he felt last week. His pool opened and he has been able to walk and exercise in  the pool. Pt also reports he has been doing his HEP and it is going well, especially working on sit to stands. Pt brings in RW that he and his wife found at a church sale.  Pt accompanied by:  Spouse  PERTINENT HISTORY: MS, L shoulder replacement  PAIN:  Are you having pain? Yes: NPRS scale: 10/10 Pain location: everywhere Pain description: numbness with shooting and electrical pain, painful, throbbing Aggravating factors: lifting L arm, carrying Relieving factors: "sun"  PRECAUTIONS: Fall  FALLS: Has patient fallen in last 6 months? Yes. Number of falls 2x/week and multiple near falls per day  PATIENT GOALS Reduce falls, improve pain in my shoulder  OBJECTIVE:   TODAY'S TREATMENT:   GAIT: Gait pattern: decreased step length- Right, decreased step length- Left, trunk flexed, and narrow BOS Distance walked: 115 ft Assistive device utilized: Walker - 2 wheeled Level of assistance: SBA Comments: Trial gait with RW this date as pt found one for sale. Assisted pt with adjusting height of RW and adding tennis balls. Pt exhibits improved safety with use of RW vs SPC with decreased gait deviations and improved balance noted. Pt does require cues for decreased shoulder elevation and decreased WB through BUE on RW as pt tends to lean forwards into RW for support.  THER ACT: Added exercises to HEP, see bolded below   PATIENT EDUCATION: Education details: HEP, gait with RW, safety with gait and transfers in the home Person educated:  Patient, Spouse Education method: Explanation, Handout Education comprehension: verbalized understanding   HOME EXERCISE PROGRAM: Access Code: [email or web address] URL: [email or web address] Date: 09/22/2021 Prepared by: Peter Congo  Exercises - Sit to Stand with Armchair  - 1 x daily - 7 x weekly - 3 sets - 10 reps - Standing Balance with Eyes Open  - 1 x daily - 7 x weekly - 3 sets - 10 reps - Seated Modified Small Range Crunch in Chair  - 1 x daily - 7 x weekly - 3 sets - 10 reps - Side Stepping with Counter Support  - 1 x daily - 7 x weekly - 3 sets - 10 reps - Backward Walking with RW Support  - 1 x daily - 7 x weekly - 3 sets - 10 reps - Alternating Step Taps with Counter/RW Support  - 1 x daily - 7 x weekly - 3 sets - 10 reps   GOALS: Goals reviewed with patient? Yes  SHORT TERM GOALS: Target date: 10/15/2021  Patient will be able to ambulate 230' with st. Cane without evidence of loss of balance to improve functional ambulation and reduce fall risk. Baseline:115' with 4 instances of LOB Goal status: INITIAL  2.  Patient will be able to use his L UE with functional transfers and also demo improved control with eccentric stand to sit to improve safe transfers. Baseline: Pt plops >90% of the with sitting Goal status: INITIAL  3.  Pt will be able to go up and down 8 stairs with use of rail/s and SBA to improve community accesss Baseline: TBD Goal status: INITIAL   LONG TERM GOALS: Target date: 11/12/2021  Pt will demo 15 sec improvement in TUG score to improve functional mobility with appropriate AD Baseline: 54 sec with st. cane Goal status: INITIAL  2.  Patient will demo 0.50 m/s or better gait speed with appropriate AD to reduce fall risk and improve community abulation Baseline: 0.32 m/s with st. cane Goal status: INITIAL  3.  Patient will demo 5  x sit to stand with bil UE support in <40 seconds to improve functional strength and safe transfers. Baseline:  53 seconds with  R UE support Goal status: INITIAL  4.  Patient will demo 6 points of improvement in BBS to improve balance and reduce fall risk Baseline: 11/56 (7/26) Goal status: INITIAL  ASSESSMENT:  CLINICAL IMPRESSION: Emphasis of skilled PT session on assessing gait with RW and adding strength/balance activities to HEP. Trial gait with RW this date after adjusting RW to proper fit for patient. Pt exhibits improved safety and balance with use of RW vs use of SPC. Recommending pt use RW in the home vs [facility] for increased safety and independence. See bolded exercises above added to HEP. Pt also exhibits significantly improved control with sit to stands this date with decreased instances of posterior LOB noted when sitting. Continue POC.   OBJECTIVE IMPAIRMENTS Abnormal gait, decreased activity tolerance, decreased balance, decreased endurance, decreased mobility, difficulty walking, decreased ROM, decreased strength, decreased safety awareness, increased fascial restrictions, impaired perceived functional ability, impaired flexibility, impaired sensation, impaired tone, impaired UE functional use, improper body mechanics, postural dysfunction, and pain.   ACTIVITY LIMITATIONS carrying, lifting, bending, sitting, standing, squatting, stairs, transfers, bed mobility, bathing, toileting, dressing, hygiene/grooming, and caring for others  PARTICIPATION LIMITATIONS: meal prep, cleaning, laundry, driving, shopping, community activity, and yard work  PERSONAL FACTORS Past/current experiences, Time since onset of injury/illness/exacerbation, and 1-2 comorbidities: MS, L shoulder replacement  are also affecting patient's functional outcome.   REHAB POTENTIAL: Good  CLINICAL DECISION MAKING: Stable/uncomplicated  EVALUATION COMPLEXITY: Low  PLAN: PT FREQUENCY: 2x/week  PT DURATION: 8 weeks  PLANNED INTERVENTIONS: Therapeutic exercises, Therapeutic activity, Neuromuscular re-education, Balance  training, Gait training, Patient/Family education, Self Care, Joint mobilization, Stair training, Orthotic/Fit training, Aquatic Therapy, Wheelchair mobility training, Spinal mobilization, Cryotherapy, Moist heat, Manual therapy, and Re-evaluation  PLAN FOR NEXT SESSION: Work on static standing balance, LE (L>R) strengthening, core strengthening, gait with LRAD      Peter Congo, PT, DPT, CSRS 10/06/2021, 11:05 AM

## 2021-10-08 ENCOUNTER — Ambulatory Visit: Payer: Medicare HMO

## 2021-10-08 DIAGNOSIS — G8929 Other chronic pain: Secondary | ICD-10-CM | POA: Diagnosis not present

## 2021-10-08 DIAGNOSIS — M25612 Stiffness of left shoulder, not elsewhere classified: Secondary | ICD-10-CM | POA: Diagnosis not present

## 2021-10-08 DIAGNOSIS — R41840 Attention and concentration deficit: Secondary | ICD-10-CM | POA: Diagnosis not present

## 2021-10-08 DIAGNOSIS — M25512 Pain in left shoulder: Secondary | ICD-10-CM | POA: Diagnosis not present

## 2021-10-08 DIAGNOSIS — R2689 Other abnormalities of gait and mobility: Secondary | ICD-10-CM | POA: Diagnosis not present

## 2021-10-08 DIAGNOSIS — R41844 Frontal lobe and executive function deficit: Secondary | ICD-10-CM | POA: Diagnosis not present

## 2021-10-08 DIAGNOSIS — M25622 Stiffness of left elbow, not elsewhere classified: Secondary | ICD-10-CM | POA: Diagnosis not present

## 2021-10-08 DIAGNOSIS — M6281 Muscle weakness (generalized): Secondary | ICD-10-CM

## 2021-10-08 NOTE — Therapy (Signed)
OUTPATIENT PHYSICAL THERAPY NEURO TREATMENT   Patient Name: Seth Lindsey MRN: [id number] DOB:02-06-1966, 56 y.o., male Today's Date: 10/08/2021   PCP: Hyman Hopes, NP REFERRING PROVIDER: Asa Lente, MD    PT End of Session - 10/08/21 1016     Visit Number 4    Number of Visits 11    Date for PT Re-Evaluation 10/29/21    Authorization Type [employer]    PT Start Time 1015    PT Stop Time 1100    PT Time Calculation (min) 45 min    Activity Tolerance Patient tolerated treatment well    Behavior During Therapy WFL for tasks assessed/performed               Past Medical History:  Diagnosis Date   Allergy    Anxiety    Depression    GERD (gastroesophageal reflux disease)    Hypertension    Multiple sclerosis (HCC)    Past Surgical History:  Procedure Laterality Date   APPENDECTOMY     arm surgery     left- biceps reattached    CERVICAL FUSION     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   Procedure: REVERSE SHOULDER ARTHROPLASTY;  Surgeon: Bjorn Pippin, MD;  Location: [facility];  Service: Orthopedics;  Laterality: Left;   Patient Active Problem List   Diagnosis Date Noted   Primary hypertension 08/26/2021   Gastroesophageal reflux disease 08/26/2021   Tobacco abuse 08/26/2021   Insomnia 02/04/2021   Depression with anxiety 02/04/2021   High risk medication use 08/03/2020   Multiple sclerosis (HCC) 03/19/2020   Gait disturbance 03/19/2020   Spasticity 03/19/2020   Injury of left upper arm 03/19/2020    ONSET DATE: 09/06/21 date of referral  REFERRING DIAG: G35 (ICD-10-CM) - Multiple sclerosis (HCC) R25.2 (ICD-10-CM) - Spasticity R26.9 (ICD-10-CM) - Gait disturbance R20.8 (ICD-10-CM) - Dysesthesia S49.92XS (ICD-10-CM) - Injury of left upper arm, sequela   THERAPY DIAG:  Muscle weakness (generalized)  Other abnormalities of gait and mobility  Rationale for Evaluation and Treatment Rehabilitation  SUBJECTIVE:                                                                                                                                                                                               SUBJECTIVE STATEMENT: Pt reports pain is 9/10 in legs, arms and shoulders..  Pt accompanied by:  Spouse  PERTINENT HISTORY: MS, L shoulder replacement  PAIN:  Are you having pain? Yes: NPRS scale: 9/10 Pain location: everywhere Pain description: numbness with shooting and electrical pain, painful, throbbing Aggravating factors: lifting L arm,  carrying Relieving factors: "sun"  PRECAUTIONS: Fall  FALLS: Has patient fallen in last 6 months? Yes. Number of falls 2x/week and multiple near falls per day  PATIENT GOALS Reduce falls, improve pain in my shoulder  OBJECTIVE:   TODAY'S TREATMENT:   Adjusted walker height by increasing 3 more notches. Pt educated on using wlaker for balance when walking not to increase excessive WB through UE for support Gait training: 1 x 115' with SBA and RW Standing balance: Standing with normal BOS; lifting bil UE in flexion to tolerance: 5 x 10 Standing trunk twists with contralateral UE reach: 5 x 10 R and L, for lat 3 sets, pt was challenged with cognitive tasks, counting up by 3s from ), counting bwd by 3s from 66, naming states  Pt and wife educated to do this with firm support to one side of him and chair behind him and wife present next to him.   PATIENT EDUCATION: Education details: HEP, gait with RW, safety with gait and transfers in the home Person educated: Patient, Spouse Education method: Explanation, Handout Education comprehension: verbalized understanding   HOME EXERCISE PROGRAM: Access Code: [email or web address] URL: [email or web address] Date: 09/22/2021 Prepared by: Peter Congo  Exercises - Sit to Stand with Armchair  - 1 x daily - 7 x weekly - 3 sets - 10 reps - Standing Balance with Eyes Open  - 1 x daily - 7 x weekly - 3 sets - 10 reps -  Seated Modified Small Range Crunch in Chair  - 1 x daily - 7 x weekly - 3 sets - 10 reps - Side Stepping with Counter Support  - 1 x daily - 7 x weekly - 3 sets - 10 reps - Backward Walking with RW Support  - 1 x daily - 7 x weekly - 3 sets - 10 reps - Alternating Step Taps with Counter/RW Support  - 1 x daily - 7 x weekly - 3 sets - 10 reps  Standing with normal BOS; lifting bil UE in flexion to tolerance: 5 x 10 Standing trunk twists with contralateral UE reach: 5 x 10 R and L   GOALS: Goals reviewed with patient? Yes  SHORT TERM GOALS: Target date: 10/15/2021  Patient will be able to ambulate 230' with st. Cane without evidence of loss of balance to improve functional ambulation and reduce fall risk. Baseline:115' with 4 instances of LOB Goal status: INITIAL  2.  Patient will be able to use his L UE with functional transfers and also demo improved control with eccentric stand to sit to improve safe transfers. Baseline: Pt plops >90% of the with sitting Goal status: INITIAL  3.  Pt will be able to go up and down 8 stairs with use of rail/s and SBA to improve community accesss Baseline: TBD Goal status: INITIAL   LONG TERM GOALS: Target date: 11/12/2021  Pt will demo 15 sec improvement in TUG score to improve functional mobility with appropriate AD Baseline: 54 sec with st. cane Goal status: INITIAL  2.  Patient will demo 0.50 m/s or better gait speed with appropriate AD to reduce fall risk and improve community abulation Baseline: 0.32 m/s with st. cane Goal status: INITIAL  3.  Patient will demo 5 x sit to stand with bil UE support in <40 seconds to improve functional strength and safe transfers. Baseline: 53 seconds with  R UE support Goal status: INITIAL  4.  Patient will demo 6 points of improvement in BBS to improve balance  and reduce fall risk Baseline: 11/56 (7/26) Goal status: INITIAL  ASSESSMENT:  CLINICAL IMPRESSION: Today's skilled session focused on working  on standing balance and incoroproating arm movements with standing balance. Pt's balance with standing trunk twist was much more controlled in sagital plane movements compared to front plane movements.   OBJECTIVE IMPAIRMENTS Abnormal gait, decreased activity tolerance, decreased balance, decreased endurance, decreased mobility, difficulty walking, decreased ROM, decreased strength, decreased safety awareness, increased fascial restrictions, impaired perceived functional ability, impaired flexibility, impaired sensation, impaired tone, impaired UE functional use, improper body mechanics, postural dysfunction, and pain.   ACTIVITY LIMITATIONS carrying, lifting, bending, sitting, standing, squatting, stairs, transfers, bed mobility, bathing, toileting, dressing, hygiene/grooming, and caring for others  PARTICIPATION LIMITATIONS: meal prep, cleaning, laundry, driving, shopping, community activity, and yard work  PERSONAL FACTORS Past/current experiences, Time since onset of injury/illness/exacerbation, and 1-2 comorbidities: MS, L shoulder replacement  are also affecting patient's functional outcome.   REHAB POTENTIAL: Good  CLINICAL DECISION MAKING: Stable/uncomplicated  EVALUATION COMPLEXITY: Low  PLAN: PT FREQUENCY: 2x/week  PT DURATION: 8 weeks  PLANNED INTERVENTIONS: Therapeutic exercises, Therapeutic activity, Neuromuscular re-education, Balance training, Gait training, Patient/Family education, Self Care, Joint mobilization, Stair training, Orthotic/Fit training, Aquatic Therapy, Wheelchair mobility training, Spinal mobilization, Cryotherapy, Moist heat, Manual therapy, and Re-evaluation  PLAN FOR NEXT SESSION: Work on static standing balance, LE (L>R) strengthening, core strengthening, gait with LRAD      Ileana Ladd, PT 10/08/2021, 11:03 AM

## 2021-10-13 ENCOUNTER — Ambulatory Visit: Payer: Medicare HMO | Admitting: Physical Therapy

## 2021-10-13 ENCOUNTER — Encounter: Payer: Self-pay | Admitting: Physical Therapy

## 2021-10-13 DIAGNOSIS — M25622 Stiffness of left elbow, not elsewhere classified: Secondary | ICD-10-CM | POA: Diagnosis not present

## 2021-10-13 DIAGNOSIS — R41840 Attention and concentration deficit: Secondary | ICD-10-CM | POA: Diagnosis not present

## 2021-10-13 DIAGNOSIS — M25512 Pain in left shoulder: Secondary | ICD-10-CM | POA: Diagnosis not present

## 2021-10-13 DIAGNOSIS — M25612 Stiffness of left shoulder, not elsewhere classified: Secondary | ICD-10-CM | POA: Diagnosis not present

## 2021-10-13 DIAGNOSIS — R41844 Frontal lobe and executive function deficit: Secondary | ICD-10-CM | POA: Diagnosis not present

## 2021-10-13 DIAGNOSIS — G8929 Other chronic pain: Secondary | ICD-10-CM | POA: Diagnosis not present

## 2021-10-13 DIAGNOSIS — R2689 Other abnormalities of gait and mobility: Secondary | ICD-10-CM

## 2021-10-13 DIAGNOSIS — M6281 Muscle weakness (generalized): Secondary | ICD-10-CM | POA: Diagnosis not present

## 2021-10-13 NOTE — Therapy (Signed)
OUTPATIENT PHYSICAL THERAPY NEURO TREATMENT   Patient Name: Seth Lindsey MRN: [id number] DOB:Mar 08, 1965, 56 y.o., male Today's Date: 10/13/2021   PCP: Caleen Jobs, NP REFERRING PROVIDER: Britt Bottom, MD    PT End of Session - 10/13/21 1014     Visit Number 5    Number of Visits 11    Date for PT Re-Evaluation 10/29/21    Authorization Type [employer]    Progress Note Due on Visit 10    PT Start Time 1012    PT Stop Time 1055    PT Time Calculation (min) 43 min    Equipment Utilized During Treatment Gait belt    Activity Tolerance Patient tolerated treatment well    Behavior During Therapy WFL for tasks assessed/performed                Past Medical History:  Diagnosis Date   Allergy    Anxiety    Depression    GERD (gastroesophageal reflux disease)    Hypertension    Multiple sclerosis ([place])    Past Surgical History:  Procedure Laterality Date   APPENDECTOMY     arm surgery     left- biceps reattached    CERVICAL FUSION     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   Procedure: REVERSE SHOULDER ARTHROPLASTY;  Surgeon: Hiram Gash, MD;  Location: [place];  Service: Orthopedics;  Laterality: Left;   Patient Active Problem List   Diagnosis Date Noted   Primary hypertension 08/26/2021   Gastroesophageal reflux disease 08/26/2021   Tobacco abuse 08/26/2021   Insomnia 02/04/2021   Depression with anxiety 02/04/2021   High risk medication use 08/03/2020   Multiple sclerosis ([place]) 03/19/2020   Gait disturbance 03/19/2020   Spasticity 03/19/2020   Injury of left upper arm 03/19/2020    ONSET DATE: 09/06/21 date of referral  REFERRING DIAG: G35 (ICD-10-CM) - Multiple sclerosis ([place]) R25.2 (ICD-10-CM) - Spasticity R26.9 (ICD-10-CM) - Gait disturbance R20.8 (ICD-10-CM) - Dysesthesia S49.92XS (ICD-10-CM) - Injury of left upper arm, sequela   THERAPY DIAG:  Muscle weakness (generalized)  Other abnormalities of gait and  mobility  Rationale for Evaluation and Treatment Rehabilitation  SUBJECTIVE:                                                                                                                                                                                              SUBJECTIVE STATEMENT: Pt's wife reports she feels like pt's "near-falls" have decreased and that balance appears better overall. Pt reports he got up quickly the other day and injured his L arm (unsure how) and  now it is hurting more, 10/10. Pt reports 9/10 pain everywhere else. Pt reports having his therapy days back to back was too much so he had to cancel some appointments. Pt's wife reports pt is so fatigued from therapy sessions he sleeps the entire next day. Pt has been going to the pool daily and walking in the pool.  Pt accompanied by:  Spouse  PERTINENT HISTORY: MS, L shoulder replacement  PAIN:  Are you having pain? Yes: NPRS scale: 9/10 Pain location: everywhere Pain description: numbness with shooting and electrical pain, painful, throbbing Aggravating factors: lifting L arm, carrying Relieving factors: "sun"  PRECAUTIONS: Fall  FALLS: Has patient fallen in last 6 months? Yes. Number of falls 2x/week and multiple near falls per day  PATIENT GOALS Reduce falls, improve pain in my shoulder  OBJECTIVE:   TODAY'S TREATMENT:     THER ACT:   [facility] PT Assessment - 10/13/21 1046       Ambulation/Gait   Gait velocity 32.8' over 17.84 sec = 1.83 ft/sec      Standardized Balance Assessment   Standardized Balance Assessment Timed Up and Go Test;Five Times Sit to Stand    Five times sit to stand comments  46.09 sec   with LUE on RW     Timed Up and Go Test   TUG Normal TUG    Normal TUG (seconds) 29.75   with RW            SELF-CARE/HOME MANAGEMENT: Education with patient and his wife about importance of avoiding heat due to sensitivity with MS diagnosis. Pt and wife report they go to the pool daily  sometimes even several times a day. Recommending that pt go to the pool in the morning to exercise to avoid excessive heat. Also recommending that pt work on his HEP in the pool as able. Educated pt and his wife about importance of continuing exercise routine even in the winter when pool is not accessible in order to not lose gains he has made so far. Pt and wife verbalize their understanding.  THER EX: -tried to perform mini-crunches while seated EOM leaning back against chair and pillow, pt has increased pain in his back/spine so exercise deferred -tried to perform 6" step-ups with 2 handrails, pt able to step up onto one step with mod A but reports significant increase in R hip and low back pain with step-up so exercise deferred  -instructed pt to decrease height of surface he is performing step-taps to at home due to ongoing difficulty with this task and increase in pain when performing   GAIT: Gait pattern: trunk flexed, shuffling of feet Distance walked: 230' Assistive device utilized: Environmental consultant - 2 wheeled Level of assistance: SBA Comments: improved safety and balance     PATIENT EDUCATION: Education details: HEP, importance of continued exercise even when not able to go outdoors, importance of avoiding excessive exposure to heat Person educated: Patient, Spouse Education method: Explanation Education comprehension: verbalized understanding   HOME EXERCISE PROGRAM: Access Code: [email or web address] URL: [email or web address] Date: 09/22/2021 Prepared by: Excell Seltzer  Exercises - Sit to Stand with Armchair  - 1 x daily - 7 x weekly - 3 sets - 10 reps - Standing Balance with Eyes Open  - 1 x daily - 7 x weekly - 3 sets - 10 reps - Seated Modified Small Range Crunch in Chair  - 1 x daily - 7 x weekly - 3 sets - 10 reps - Side Stepping with Counter Support  -  1 x daily - 7 x weekly - 3 sets - 10 reps - Backward Walking with RW Support  - 1 x daily - 7 x weekly - 3 sets - 10  reps - Alternating Step Taps with Counter/RW Support  - 1 x daily - 7 x weekly - 3 sets - 10 reps  -Standing with normal BOS; lifting bil UE in flexion to tolerance: 5 x 10 -Standing trunk twists with contralateral UE reach: 5 x 10 R and L   GOALS: Goals reviewed with patient? Yes  SHORT TERM GOALS: Target date: 10/15/2021  Patient will be able to ambulate 230' with st. Cane without evidence of loss of balance to improve functional ambulation and reduce fall risk. Baseline:115' with 4 instances of LOB, 47' with RW no LOB Goal status: MET  2.  Patient will be able to use his L UE with functional transfers and also demo improved control with eccentric stand to sit to improve safe transfers. Baseline: Pt plops >90% of the with sitting, poor eccentric control with 10% of stand to sits Goal status: MET  3.  Pt will be able to go up and down 8 stairs with use of rail/s and SBA to improve community accesss Baseline: TBD Goal status: NOT MET   LONG TERM GOALS: Target date: 11/12/2021  Pt will demo 15 sec improvement in TUG score to improve functional mobility with appropriate AD Baseline: 54 sec with st. Cane, 29.75 with RW (8/15) Goal status: MET  2.  Patient will demo 0.50 m/s or better gait speed with appropriate AD to reduce fall risk and improve community abulation Baseline: 0.32 m/s with st. Cane, 0.56 m/s with RW (1.83 ft/s) (8/15) Goal status: MET  3.  Patient will demo 5 x sit to stand with bil UE support in <40 seconds to improve functional strength and safe transfers. Baseline: 53 seconds with  R UE support, 46 sec with RW (8/15) Goal status: IN PROGRESS  4.  Patient will demo 6 points of improvement in BBS to improve balance and reduce fall risk Baseline: 11/56 (7/26) Goal status: INITIAL  ASSESSMENT:  CLINICAL IMPRESSION: Emphasis of skilled PT session on initiating STG assessment. Pt exhibits improved endurance and balance overall as evidenced by increased gait  distance of 230 ft as compared to 115 ft at initial evaluation. Pt initially was using a SPC for therapy sessions but has transitioned to using a RW during therapy and in his home environment as this allows him to be safer and more independent with functional mobility. Pt exhibits no LOB during gait with RW at Supervision level. Pt also demonstrates improved eccentric control when performing stand to sit transfers. Pt has not met and is not making progress towards his stair goal as he currently still requires up to mod A to safely navigate one step with 2 handrails. Pt does not have stairs he has to navigate in his home environment except for one step that he encounters outdoors and is currently navigating with his [place]. Pt is unsafe to navigate a flight of stairs at this time. Reassessed pt's gait speed, TUG score, and 5xSTS score. Pt demonstrates improved gait speed from 0.32 m/s with SPC to 0.56 m/s with RW, improved TUG score from 54 sec with SPC to 29.75 sec with RW, and improved 5xSTS score from 53 sec to 46 sec. Educated pt and his wife on importance of avoiding excessive heat with MS diagnosis and to limit times in the pool to cooler  times of day such as the morning. Continue POC.   OBJECTIVE IMPAIRMENTS Abnormal gait, decreased activity tolerance, decreased balance, decreased endurance, decreased mobility, difficulty walking, decreased ROM, decreased strength, decreased safety awareness, increased fascial restrictions, impaired perceived functional ability, impaired flexibility, impaired sensation, impaired tone, impaired UE functional use, improper body mechanics, postural dysfunction, and pain.   ACTIVITY LIMITATIONS carrying, lifting, bending, sitting, standing, squatting, stairs, transfers, bed mobility, bathing, toileting, dressing, hygiene/grooming, and caring for others  PARTICIPATION LIMITATIONS: meal prep, cleaning, laundry, driving, shopping, community activity, and yard work  PERSONAL  FACTORS Past/current experiences, Time since onset of injury/illness/exacerbation, and 1-2 comorbidities: MS, L shoulder replacement  are also affecting patient's functional outcome.   REHAB POTENTIAL: Good  CLINICAL DECISION MAKING: Stable/uncomplicated  EVALUATION COMPLEXITY: Low  PLAN: PT FREQUENCY: 2x/week  PT DURATION: 8 weeks  PLANNED INTERVENTIONS: Therapeutic exercises, Therapeutic activity, Neuromuscular re-education, Balance training, Gait training, Patient/Family education, Self Care, Joint mobilization, Stair training, Orthotic/Fit training, Aquatic Therapy, Wheelchair mobility training, Spinal mobilization, Cryotherapy, Moist heat, Manual therapy, and Re-evaluation  PLAN FOR NEXT SESSION: STG assess next session maybe add one step goal with SPC, reassess BBS, work on 4" to 6" step-ups, work on static standing balance, LE (L>R) strengthening, core strengthening, gait with LRAD        Excell Seltzer, PT, DPT, CSRS 10/13/2021, 11:01 AM

## 2021-10-15 ENCOUNTER — Encounter: Payer: Self-pay | Admitting: Physical Therapy

## 2021-10-15 ENCOUNTER — Ambulatory Visit: Payer: Medicare HMO | Admitting: Occupational Therapy

## 2021-10-15 ENCOUNTER — Encounter: Payer: Self-pay | Admitting: Occupational Therapy

## 2021-10-15 ENCOUNTER — Ambulatory Visit: Payer: Medicare HMO | Admitting: Physical Therapy

## 2021-10-15 DIAGNOSIS — R2689 Other abnormalities of gait and mobility: Secondary | ICD-10-CM

## 2021-10-15 DIAGNOSIS — M25612 Stiffness of left shoulder, not elsewhere classified: Secondary | ICD-10-CM | POA: Diagnosis not present

## 2021-10-15 DIAGNOSIS — G8929 Other chronic pain: Secondary | ICD-10-CM | POA: Diagnosis not present

## 2021-10-15 DIAGNOSIS — R41840 Attention and concentration deficit: Secondary | ICD-10-CM | POA: Diagnosis not present

## 2021-10-15 DIAGNOSIS — M25622 Stiffness of left elbow, not elsewhere classified: Secondary | ICD-10-CM

## 2021-10-15 DIAGNOSIS — R41844 Frontal lobe and executive function deficit: Secondary | ICD-10-CM | POA: Diagnosis not present

## 2021-10-15 DIAGNOSIS — M6281 Muscle weakness (generalized): Secondary | ICD-10-CM

## 2021-10-15 DIAGNOSIS — M25512 Pain in left shoulder: Secondary | ICD-10-CM | POA: Diagnosis not present
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
## 2021-10-15 NOTE — Therapy (Signed)
OUTPATIENT PHYSICAL THERAPY NEURO TREATMENT   Patient Name: Seth Lindsey MRN: [id number] DOB:May 18, 1965, 56 y.o., male Today's Date: 10/15/2021   PCP: Caleen Jobs, NP REFERRING PROVIDER: Britt Bottom, MD    PT End of Session - 10/15/21 1018     Visit Number 6    Number of Visits 11    Date for PT Re-Evaluation 10/29/21    Authorization Type [employer]    Progress Note Due on Visit 10    PT Start Time 1016    PT Stop Time 1058    PT Time Calculation (min) 42 min    Equipment Utilized During Treatment Gait belt    Activity Tolerance Patient tolerated treatment well    Behavior During Therapy WFL for tasks assessed/performed                 Past Medical History:  Diagnosis Date   Allergy    Anxiety    Depression    GERD (gastroesophageal reflux disease)    Hypertension    Multiple sclerosis ([place])    Past Surgical History:  Procedure Laterality Date   APPENDECTOMY     arm surgery     left- biceps reattached    CERVICAL FUSION     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   Procedure: REVERSE SHOULDER ARTHROPLASTY;  Surgeon: Hiram Gash, MD;  Location: [facility];  Service: Orthopedics;  Laterality: Left;   Patient Active Problem List   Diagnosis Date Noted   Primary hypertension 08/26/2021   Gastroesophageal reflux disease 08/26/2021   Tobacco abuse 08/26/2021   Insomnia 02/04/2021   Depression with anxiety 02/04/2021   High risk medication use 08/03/2020   Multiple sclerosis ([place]) 03/19/2020   Gait disturbance 03/19/2020   Spasticity 03/19/2020   Injury of left upper arm 03/19/2020    ONSET DATE: 09/06/21 date of referral  REFERRING DIAG: G35 (ICD-10-CM) - Multiple sclerosis ([place]) R25.2 (ICD-10-CM) - Spasticity R26.9 (ICD-10-CM) - Gait disturbance R20.8 (ICD-10-CM) - Dysesthesia S49.92XS (ICD-10-CM) - Injury of left upper arm, sequela   THERAPY DIAG:  Muscle weakness (generalized)  Other abnormalities of gait and  mobility  Rationale for Evaluation and Treatment Rehabilitation  SUBJECTIVE:                                                                                                                                                                                              SUBJECTIVE STATEMENT: Pt reports he is feeling better today than last session but still hurting in LUE, just had OT eval before this session. Pt reports feeling fatigued after last therapy session, he went  home and took a nap. Pt reports he has not had time to work on his HEP lately due to fatigue and spending time at the pool. Pt requesting to decrease frequency of PT to 1x/week due to fatigue.  Pt accompanied by:  Spouse  PERTINENT HISTORY: MS, L shoulder replacement  PAIN:  Are you having pain? Yes: NPRS scale: 9/10 Pain location: everywhere but LUE more than rest of his body Pain description: numbness with shooting and electrical pain, painful, throbbing Aggravating factors: lifting L arm, carrying Relieving factors: "sun"  PRECAUTIONS: Fall  FALLS: Has patient fallen in last 6 months? Yes. Number of falls 2x/week and multiple near falls per day  PATIENT GOALS Reduce falls, improve pain in my shoulder  OBJECTIVE:   TODAY'S TREATMENT:     THER ACT:    [facility] PT Assessment - 10/15/21 1027       Standardized Balance Assessment   Standardized Balance Assessment Berg Balance Test      Berg Balance Test   Sit to Stand Able to stand  independently using hands    Standing Unsupported Able to stand 2 minutes with supervision    Sitting with Back Unsupported but Feet Supported on Floor or Stool Able to sit safely and securely 2 minutes    Stand to Sit Controls descent by using hands    Transfers Needs one person to assist    Standing Unsupported with Eyes Closed Able to stand 10 seconds with supervision    Standing Unsupported with Feet Together Needs help to attain position but able to stand for 30 seconds with feet  together    From Standing, Reach Forward with Outstretched Arm Loses balance while trying/requires external support    From Standing Position, Pick up Object from Floor Unable to try/needs assist to keep balance    From Standing Position, Turn to Look Behind Over each Shoulder Needs supervision when turning    Turn 360 Degrees Needs close supervision or verbal cueing    Standing Unsupported, Alternately Place Feet on Step/Stool Needs assistance to keep from falling or unable to try    Standing Unsupported, One Foot in [employer] balance while stepping or standing    Standing on One Leg Tries to lift leg/unable to hold 3 seconds but remains standing independently    Total Score 21    Berg comment: 21/56            Ernesta Amble, see above. Pt improved from score of 11/56 on 7/26 to 21/56 this date.  Standing alt L/R 6" step-taps at bottom of stairs with use of R handrail with heavy forearm support, 2 x 10 reps with min A for balance. Attempt to have pt lift LE up to 2nd step but unable to due to weakness and decreased hip ROM. Pt fatigues very quickly with step-taps and requires seated rest break.  THER EX: SciFit level 1 x 7 min with use of RUE and BLE for global endurance and gentle stretching of UE and LE stretching. Pt reports RPE of 8/10 following exercise.    PATIENT EDUCATION: Education details: continue HEP, POC change to 1x/week Person educated: Patient, Spouse Education method: Explanation Education comprehension: verbalized understanding   HOME EXERCISE PROGRAM: Access Code: [email or web address] URL: [email or web address] Date: 09/22/2021 Prepared by: Excell Seltzer  Exercises - Sit to Stand with Armchair  - 1 x daily - 7 x weekly - 3 sets - 10 reps - Standing Balance with Eyes Open  - 1 x daily -  7 x weekly - 3 sets - 10 reps - Seated Modified Small Range Crunch in Chair  - 1 x daily - 7 x weekly - 3 sets - 10 reps - Side Stepping with Counter Support  - 1 x  daily - 7 x weekly - 3 sets - 10 reps - Backward Walking with RW Support  - 1 x daily - 7 x weekly - 3 sets - 10 reps - Alternating Step Taps with Counter/RW Support  - 1 x daily - 7 x weekly - 3 sets - 10 reps  -Standing with normal BOS; lifting bil UE in flexion to tolerance: 5 x 10 -Standing trunk twists with contralateral UE reach: 5 x 10 R and L   GOALS: Goals reviewed with patient? Yes  SHORT TERM GOALS: Target date: 10/15/2021  Patient will be able to ambulate 230' with st. Cane without evidence of loss of balance to improve functional ambulation and reduce fall risk. Baseline:115' with 4 instances of LOB, 79' with RW no LOB Goal status: MET  2.  Patient will be able to use his L UE with functional transfers and also demo improved control with eccentric stand to sit to improve safe transfers. Baseline: Pt plops >90% of the with sitting, poor eccentric control with 10% of stand to sits Goal status: MET  3.  Pt will be able to go up and down 8 stairs with use of rail/s and SBA to improve community accesss Baseline: TBD Goal status: NOT MET, goal d/c due to unsafe to attempt   LONG TERM GOALS: Target date: 11/12/2021  Pt will demo 15 sec improvement in TUG score to improve functional mobility with appropriate AD Baseline: 54 sec with st. Cane, 29.75 with RW (8/15) Goal status: MET  2.  Patient will demo 0.50 m/s or better gait speed with appropriate AD to reduce fall risk and improve community abulation Baseline: 0.32 m/s with st. Cane, 0.56 m/s with RW (1.83 ft/s) (8/15) Goal status: MET  3.  Patient will demo 5 x sit to stand with bil UE support in <40 seconds to improve functional strength and safe transfers. Baseline: 53 seconds with  R UE support, 46 sec with RW (8/15) Goal status: IN PROGRESS  4.  Patient will demo 20 points of improvement in BBS to improve balance and reduce fall risk Baseline: 11/56 (7/26), 21/56 (8/18) Goal status: REVISED/UPGRADED   5.   Patient will demonstrate ability to navigate up/down one 6" curb with LRAD and Supervision in order to safely navigate his home environment Baseline: not assessed Goal status: INITIAL    ASSESSMENT:  CLINICAL IMPRESSION: Emphasis of skilled PT session on completion of STG assessment, upgrading and modifying goals as appropriate, and modifying POC from 2x/week to 1x/week due to self-reported fatigue and patient inability to tolerate current POC. Pt demonstrates improved balance with improved BBS score from 11/56 (7/26) to 21/56. BBS goal revised and upgraded due to patient progress. Pt also demonstrates improved safety with gait with RW vs SPC, improved eccentric control when sitting, improved gait speed, and improved balance as noted by TUG score as outlined in previous PT note. Added goal for patient to be able to safely navigate one curb step as he must navigate this in his home environment and currently reports some difficulty with safe navigation. Pt continues to make progress with physical therapy and can benefit from ongoing services to address balance and strength impairments and decrease fall risk. Change POC to 1x/week instead of  2x/week due to decreased tolerance and increased fatigue.   OBJECTIVE IMPAIRMENTS Abnormal gait, decreased activity tolerance, decreased balance, decreased endurance, decreased mobility, difficulty walking, decreased ROM, decreased strength, decreased safety awareness, increased fascial restrictions, impaired perceived functional ability, impaired flexibility, impaired sensation, impaired tone, impaired UE functional use, improper body mechanics, postural dysfunction, and pain.   ACTIVITY LIMITATIONS carrying, lifting, bending, sitting, standing, squatting, stairs, transfers, bed mobility, bathing, toileting, dressing, hygiene/grooming, and caring for others  PARTICIPATION LIMITATIONS: meal prep, cleaning, laundry, driving, shopping, community activity, and yard  work  PERSONAL FACTORS Past/current experiences, Time since onset of injury/illness/exacerbation, and 1-2 comorbidities: MS, L shoulder replacement  are also affecting patient's functional outcome.   REHAB POTENTIAL: Good  CLINICAL DECISION MAKING: Stable/uncomplicated  EVALUATION COMPLEXITY: Low  PLAN: PT FREQUENCY: 1x/week  PT DURATION: 8 weeks  PLANNED INTERVENTIONS: Therapeutic exercises, Therapeutic activity, Neuromuscular re-education, Balance training, Gait training, Patient/Family education, Self Care, Joint mobilization, Stair training, Orthotic/Fit training, Aquatic Therapy, Wheelchair mobility training, Spinal mobilization, Cryotherapy, Moist heat, Manual therapy, and Re-evaluation  PLAN FOR NEXT SESSION: gumdrop taps, work on static standing balance with decreased UE support, LE (L>R) strengthening, core strengthening, stretching (anterior leans on therapy ball?), gait with LRAD, discuss energy conservation       Excell Seltzer, PT, DPT, CSRS 10/15/2021, 10:59 AM

## 2021-10-18 ENCOUNTER — Other Ambulatory Visit: Payer: Self-pay

## 2021-10-18 DIAGNOSIS — F17210 Nicotine dependence, cigarettes, uncomplicated: Secondary | ICD-10-CM

## 2021-10-18 DIAGNOSIS — Z122 Encounter for screening for malignant neoplasm of respiratory organs: Secondary | ICD-10-CM

## 2021-10-18 DIAGNOSIS — Z87891 Personal history of nicotine dependence: Secondary | ICD-10-CM

## 2021-10-20 ENCOUNTER — Ambulatory Visit: Payer: Medicare HMO | Admitting: Occupational Therapy

## 2021-10-20 ENCOUNTER — Ambulatory Visit: Payer: Medicare HMO | Admitting: Physical Therapy

## 2021-10-20 DIAGNOSIS — R2689 Other abnormalities of gait and mobility: Secondary | ICD-10-CM | POA: Diagnosis not present

## 2021-10-20 DIAGNOSIS — R41840 Attention and concentration deficit: Secondary | ICD-10-CM

## 2021-10-20 DIAGNOSIS — M6281 Muscle weakness (generalized): Secondary | ICD-10-CM

## 2021-10-20 DIAGNOSIS — M25612 Stiffness of left shoulder, not elsewhere classified: Secondary | ICD-10-CM

## 2021-10-20 DIAGNOSIS — G8929 Other chronic pain: Secondary | ICD-10-CM | POA: Diagnosis not present

## 2021-10-20 DIAGNOSIS — M25512 Pain in left shoulder: Secondary | ICD-10-CM | POA: Diagnosis not present

## 2021-10-20 DIAGNOSIS — M25622 Stiffness of left elbow, not elsewhere classified: Secondary | ICD-10-CM | POA: Diagnosis not present

## 2021-10-20 DIAGNOSIS — R41844 Frontal lobe and executive function deficit: Secondary | ICD-10-CM | POA: Diagnosis not present

## 2021-10-20 NOTE — Therapy (Signed)
OUTPATIENT OCCUPATIONAL THERAPY NEURO Treatment  Patient Name: Seth Lindsey MRN: [id number] DOB:08-14-1965, 55 y.o., male Today's Date: 10/20/2021  PCP: Dr. Reola Calkins REFERRING PROVIDER: Dr. Epimenio Foot   OT End of Session - 10/20/21 1140     Visit Number 2    Number of Visits 13    Date for OT Re-Evaluation 01/07/22    Authorization Type [employer]    Authorization - Visit Number 2    Authorization - Number of Visits 10    OT Start Time [phone]    OT Stop Time 1015    OT Time Calculation (min) 41 min    Behavior During Therapy WFL for tasks assessed/performed              Past Medical History:  Diagnosis Date   Allergy    Anxiety    Depression    GERD (gastroesophageal reflux disease)    Hypertension    Multiple sclerosis (HCC)    Past Surgical History:  Procedure Laterality Date   APPENDECTOMY     arm surgery     left- biceps reattached    CERVICAL FUSION     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   Procedure: REVERSE SHOULDER ARTHROPLASTY;  Surgeon: Bjorn Pippin, MD;  Location: [facility];  Service: Orthopedics;  Laterality: Left;   Patient Active Problem List   Diagnosis Date Noted   Primary hypertension 08/26/2021   Gastroesophageal reflux disease 08/26/2021   Tobacco abuse 08/26/2021   Insomnia 02/04/2021   Depression with anxiety 02/04/2021   High risk medication use 08/03/2020   Multiple sclerosis (HCC) 03/19/2020   Gait disturbance 03/19/2020   Spasticity 03/19/2020   Injury of left upper arm 03/19/2020    ONSET DATE: 09/20/21  REFERRING DIAG: M25.512 (ICD-10-CM) - Left shoulder pain, unspecified G35 (ICD-10-CM) - Multiple sclerosis (HCC)  THERAPY DIAG:  Muscle weakness (generalized)  Other abnormalities of gait and mobility  Chronic left shoulder pain  Stiffness of left shoulder, not elsewhere classified  Stiffness of left elbow, not elsewhere classified  Frontal lobe and executive function deficit  Attention and  concentration deficit  Rationale for Evaluation and Treatment Rehabilitation  SUBJECTIVE:   SUBJECTIVE STATEMENT: Pt reports ongoing  Left shoulder and arm pain Pt accompanied by: significant other fiancee  PERTINENT HISTORY: MS, Pt had accident where he tore his L biceps tendon and they had to reattach it and also had broken bone fragments., he waited 6 mons for surgery due to Covid. He had L shoulder replacement last September and he had PT afterwards. PMH: HTN, hx of cervical fusion, HTN, ADHD, dyslexia  PRECAUTIONS: Fall, hx of L shoulder replacement Sept. 2022, hx of biceps tear LUE, cervical fusion  WEIGHT BEARING RESTRICTIONS No  PAIN:  Are you having pain? Yes: NPRS scale: 9/10-10/10 Pain location: left shoulder and generalized Pain description: aching Aggravating factors: reaching Relieving factors: rest  FALLS: Has patient fallen in last 6 months? Yes. Number of falls multiple  LIVING ENVIRONMENT: Lives with: lives with their spouse(fiancee) Lives in: House/apartment  Has following equipment at home: Single point cane and up walker  PLOF: Independent with basic ADLs and Independent with gait  PATIENT GOALS improved LUE function  OBJECTIVE:    OBSERVATIONS: Pt demonstrates significant pain today limiting participation   TODAY'S TREATMENT:    Pt transitioned from seated to supine quickly without control, therapist reviewed proper technique with pt. Supine on mat, therapist attempted gentle AA/ROM shoulder flexion, pt was unable to tolerate. Gentle passive  shoulder abduction small  range, until pt started to spasm. Rest break tasken Sidelying on Left shoulder A/ROM and AA/ROM elbow flexion and extension. Seated on Mat low range A/ROM, AA/ROM rolling ball forwards / backwards Seated ball under left elbow for gentle body on arm movements to left side Pt sat and supported LUE with right UE to perform gentle trunk rotation.  Pt was limited throughout treatment by  significant pain, hypersensitivity to touch, and spasm noted, frequency rest breaks required. Pt with significant pain       HOME EXERCISE PROGRAM: N/A    GOALS: Goals reviewed with patient? No  SHORT TERM GOALS: Target date: 11/14/21  I with initial HEP. Baseline: Goal status: INITIAL  2.  I with pain reduction strategies and positioning for LUE. Baseline:  Goal status: INITIAL  3.  I with energy conservation techniques. Baseline:  Goal status: INITIAL  4.  I with adapted equipment/ adapted strategies to increase I with ADLS/IADLS(ie: tub transfer bench, reacher for LB dressing) Baseline:  Goal status: INITIAL  5.  Pt will demonstrate ability to retrieve a lightweight object at 85* with pain no greater than 6/10. Baseline: 75*, pain 9/10 Goal status: INITIAL  6.  Test box and blocks and set goal Baseline:  Goal status: INITIAL  LONG TERM GOALS: Target date: 01/07/22  I with updated HEP. Baseline:  Goal status: INITIAL  2.  I with memory compensation strategies. Baseline:  Goal status: INITIAL  3.  Pt will demonstrate improved fine motor coordination  for ADLs as evidenced by decreasing 9 hole peg test score to 52 secs or less. Baseline: 62 secs Goal status: INITIAL  4.  Pt will increase LUE grip strength to 30 lbs or greater for increased functional use. Baseline: 24.6 Goal status: INITIAL  5.  Pt will perform LB dressing modified independently. Baseline: dependent for shoes and socks Goal status: INITIAL  6. Pt will report using LUE as a non dominant assist for ADLS at least 50% of the time. Baseline: not using consistently Goal status: INITIAL  ASSESSMENT:  CLINICAL IMPRESSION: Pt is limited by severe pain. He may benefit from pain management and aquatic therapy.  PERFORMANCE DEFICITS in functional skills including ADLs, IADLs, coordination, dexterity, sensation, ROM, strength, pain, flexibility, FMC, GMC, balance, decreased knowledge of  precautions, decreased knowledge of use of DME, and UE functional use, cognitive skills including attention, learn, memory, problem solving, safety awareness, sequencing, thought, and understand, and psychosocial skills including coping strategies, environmental adaptation, habits, interpersonal interactions, and routines and behaviors.   IMPAIRMENTS are limiting patient from ADLs, IADLs, rest and sleep, play, leisure, and social participation.   COMORBIDITIES may have co-morbidities  that affects occupational performance. Patient will benefit from skilled OT to address above impairments and improve overall function.  MODIFICATION OR ASSISTANCE TO COMPLETE EVALUATION: Min-Moderate modification of tasks or assist with assess necessary to complete an evaluation.  OT OCCUPATIONAL PROFILE AND HISTORY: Detailed assessment: Review of records and additional review of physical, cognitive, psychosocial history related to current functional performance.  CLINICAL DECISION MAKING: LOW - limited treatment options, no task modification necessary  REHAB POTENTIAL: Good  EVALUATION COMPLEXITY: Low    PLAN: OT FREQUENCY: 1x/week  OT DURATION: 12 weeks plus eval  PLANNED INTERVENTIONS: self care/ADL training, therapeutic exercise, therapeutic activity, neuromuscular re-education, manual therapy, passive range of motion, balance training, functional mobility training, electrical stimulation, ultrasound, paraffin, fluidotherapy, moist heat, cryotherapy, patient/family education, cognitive remediation/compensation, visual/perceptual remediation/compensation, energy conservation, coping strategies training, DME and/or  AE instructions, and Re-evaluation  RECOMMENDED OTHER SERVICES: PT  CONSULTED AND AGREED WITH PLAN OF CARE: Patient and family member/caregiver  PLAN FOR NEXT SESSION:  pain reduction strategies, consider aquatic therapy, possible referral to pain management?   Jairon Ripberger, OT 10/20/2021,  11:49 AM Keene Breath, OTR/L [phone] Phone: [phone] 12:02 PM 10/20/21

## 2021-10-20 NOTE — Therapy (Signed)
OUTPATIENT PHYSICAL THERAPY NEURO TREATMENT   Patient Name: Seth Lindsey MRN: [id number] DOB:10-17-1965, 56 y.o., male Today's Date: 10/20/2021   PCP: Caleen Jobs, NP REFERRING PROVIDER: Britt Bottom, MD    PT End of Session - 10/20/21 1021     Visit Number --    Number of Visits 11    Date for PT Re-Evaluation 10/29/21    Authorization Type [employer]    Progress Note Due on Visit 10    PT Start Time 1015    PT Stop Time 1025   patient left early-too painful to participate   PT Time Calculation (min) 10 min    Equipment Utilized During Treatment Gait belt    Activity Tolerance Patient tolerated treatment well    Behavior During Therapy WFL for tasks assessed/performed                  Past Medical History:  Diagnosis Date   Allergy    Anxiety    Depression    GERD (gastroesophageal reflux disease)    Hypertension    Multiple sclerosis ([place])    Past Surgical History:  Procedure Laterality Date   APPENDECTOMY     arm surgery     left- biceps reattached    CERVICAL FUSION     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   Procedure: REVERSE SHOULDER ARTHROPLASTY;  Surgeon: Hiram Gash, MD;  Location: [facility];  Service: Orthopedics;  Laterality: Left;   Patient Active Problem List   Diagnosis Date Noted   Primary hypertension 08/26/2021   Gastroesophageal reflux disease 08/26/2021   Tobacco abuse 08/26/2021   Insomnia 02/04/2021   Depression with anxiety 02/04/2021   High risk medication use 08/03/2020   Multiple sclerosis ([place]) 03/19/2020   Gait disturbance 03/19/2020   Spasticity 03/19/2020   Injury of left upper arm 03/19/2020    ONSET DATE: 09/06/21 date of referral  REFERRING DIAG: G35 (ICD-10-CM) - Multiple sclerosis ([place]) R25.2 (ICD-10-CM) - Spasticity R26.9 (ICD-10-CM) - Gait disturbance R20.8 (ICD-10-CM) - Dysesthesia S49.92XS (ICD-10-CM) - Injury of left upper arm, sequela   THERAPY DIAG:  Muscle weakness  (generalized)  Other abnormalities of gait and mobility  Rationale for Evaluation and Treatment Rehabilitation  SUBJECTIVE:                                                                                                                                                                                              SUBJECTIVE STATEMENT: Pt present following OT session, reports he is currently in "10+/10" pain due to attempts to move LUE. Pt closing his eyes while seated  on mat table and unable to participate in PT session this date due to pain.  Pt accompanied by:  Spouse  PERTINENT HISTORY: MS, L shoulder replacement  PAIN:  Are you having pain? Yes: NPRS scale: 9/10 Pain location: everywhere but LUE more than rest of his body Pain description: numbness with shooting and electrical pain, painful, throbbing Aggravating factors: lifting L arm, carrying Relieving factors: "sun"  PRECAUTIONS: Fall  FALLS: Has patient fallen in last 6 months? Yes. Number of falls 2x/week and multiple near falls per day  PATIENT GOALS Reduce falls, improve pain in my shoulder  OBJECTIVE:   TODAY'S TREATMENT:  NA due to pt unable to participate this session    PATIENT EDUCATION: Education details: continue HEP Person educated: Patient, Spouse Education method: Explanation Education comprehension: verbalized understanding   HOME EXERCISE PROGRAM: Access Code: [email or web address] URL: [email or web address] Date: 09/22/2021 Prepared by: Excell Seltzer  Exercises - Sit to Stand with Armchair  - 1 x daily - 7 x weekly - 3 sets - 10 reps - Standing Balance with Eyes Open  - 1 x daily - 7 x weekly - 3 sets - 10 reps - Seated Modified Small Range Crunch in Chair  - 1 x daily - 7 x weekly - 3 sets - 10 reps - Side Stepping with Counter Support  - 1 x daily - 7 x weekly - 3 sets - 10 reps - Backward Walking with RW Support  - 1 x daily - 7 x weekly - 3 sets - 10 reps - Alternating Step Taps with  Counter/RW Support  - 1 x daily - 7 x weekly - 3 sets - 10 reps  -Standing with normal BOS; lifting bil UE in flexion to tolerance: 5 x 10 -Standing trunk twists with contralateral UE reach: 5 x 10 R and L   GOALS: Goals reviewed with patient? Yes  SHORT TERM GOALS: Target date: 10/15/2021  Patient will be able to ambulate 230' with st. Cane without evidence of loss of balance to improve functional ambulation and reduce fall risk. Baseline:115' with 4 instances of LOB, 67' with RW no LOB Goal status: MET  2.  Patient will be able to use his L UE with functional transfers and also demo improved control with eccentric stand to sit to improve safe transfers. Baseline: Pt plops >90% of the with sitting, poor eccentric control with 10% of stand to sits Goal status: MET  3.  Pt will be able to go up and down 8 stairs with use of rail/s and SBA to improve community accesss Baseline: TBD Goal status: NOT MET, goal d/c due to unsafe to attempt   LONG TERM GOALS: Target date: 11/12/2021  Pt will demo 15 sec improvement in TUG score to improve functional mobility with appropriate AD Baseline: 54 sec with st. Cane, 29.75 with RW (8/15) Goal status: MET  2.  Patient will demo 0.50 m/s or better gait speed with appropriate AD to reduce fall risk and improve community abulation Baseline: 0.32 m/s with st. Cane, 0.56 m/s with RW (1.83 ft/s) (8/15) Goal status: MET  3.  Patient will demo 5 x sit to stand with bil UE support in <40 seconds to improve functional strength and safe transfers. Baseline: 53 seconds with  R UE support, 46 sec with RW (8/15) Goal status: IN PROGRESS  4.  Patient will demo 20 points of improvement in BBS to improve balance and reduce fall risk Baseline: 11/56 (7/26), 21/56 (8/18) Goal status: REVISED/UPGRADED  5.  Patient will demonstrate ability to navigate up/down one 6" curb with LRAD and Supervision in order to safely navigate his home environment Baseline: not  assessed Goal status: INITIAL    ASSESSMENT:  CLINICAL IMPRESSION: Pt unable to participate in PT session this date due to "10+/10" pain.    OBJECTIVE IMPAIRMENTS Abnormal gait, decreased activity tolerance, decreased balance, decreased endurance, decreased mobility, difficulty walking, decreased ROM, decreased strength, decreased safety awareness, increased fascial restrictions, impaired perceived functional ability, impaired flexibility, impaired sensation, impaired tone, impaired UE functional use, improper body mechanics, postural dysfunction, and pain.   ACTIVITY LIMITATIONS carrying, lifting, bending, sitting, standing, squatting, stairs, transfers, bed mobility, bathing, toileting, dressing, hygiene/grooming, and caring for others  PARTICIPATION LIMITATIONS: meal prep, cleaning, laundry, driving, shopping, community activity, and yard work  PERSONAL FACTORS Past/current experiences, Time since onset of injury/illness/exacerbation, and 1-2 comorbidities: MS, L shoulder replacement  are also affecting patient's functional outcome.   REHAB POTENTIAL: Good  CLINICAL DECISION MAKING: Stable/uncomplicated  EVALUATION COMPLEXITY: Low  PLAN: PT FREQUENCY: 1x/week  PT DURATION: 8 weeks  PLANNED INTERVENTIONS: Therapeutic exercises, Therapeutic activity, Neuromuscular re-education, Balance training, Gait training, Patient/Family education, Self Care, Joint mobilization, Stair training, Orthotic/Fit training, Aquatic Therapy, Wheelchair mobility training, Spinal mobilization, Cryotherapy, Moist heat, Manual therapy, and Re-evaluation  PLAN FOR NEXT SESSION: gumdrop taps, work on static standing balance with decreased UE support, LE (L>R) strengthening, core strengthening, stretching (anterior leans on therapy ball?), gait with LRAD, discuss energy conservation       Excell Seltzer, PT, DPT, CSRS 10/20/2021, 10:33 AM

## 2021-10-22 ENCOUNTER — Ambulatory Visit: Payer: Medicare HMO

## 2021-10-26 ENCOUNTER — Telehealth: Payer: Self-pay | Admitting: Occupational Therapy

## 2021-10-26 ENCOUNTER — Ambulatory Visit: Payer: Medicare HMO | Admitting: Physical Therapy

## 2021-10-26 ENCOUNTER — Ambulatory Visit: Payer: Medicare HMO | Admitting: Occupational Therapy

## 2021-10-26 NOTE — Telephone Encounter (Signed)
Dr. Epimenio Lindsey,  Seth Lindsey demonstrates significant shoulder pain that limits his ability to participate in therapies. He could benefit from a referral to Dr. Wynn Banker office  [facility].The patient is in agreement with this referral. Thanks, Keene Breath, OTR/L

## 2021-10-27 ENCOUNTER — Ambulatory Visit: Payer: Medicare HMO | Admitting: Physical Therapy

## 2021-10-28 ENCOUNTER — Encounter: Payer: Medicare HMO | Admitting: Occupational Therapy

## 2021-10-28 ENCOUNTER — Other Ambulatory Visit: Payer: Self-pay | Admitting: Neurology

## 2021-10-28 DIAGNOSIS — G8929 Other chronic pain: Secondary | ICD-10-CM

## 2021-10-28 DIAGNOSIS — G35 Multiple sclerosis: Secondary | ICD-10-CM

## 2021-10-28 NOTE — Progress Notes (Signed)
B pmr

## 2021-10-29 ENCOUNTER — Ambulatory Visit: Payer: Medicare HMO

## 2021-11-02 ENCOUNTER — Ambulatory Visit: Payer: Medicare HMO | Admitting: Physical Therapy

## 2021-11-03 ENCOUNTER — Encounter: Payer: Self-pay | Admitting: Physical Medicine & Rehabilitation

## 2021-11-04 ENCOUNTER — Ambulatory Visit: Payer: Medicare HMO | Admitting: Physical Therapy

## 2021-11-04 ENCOUNTER — Ambulatory Visit: Payer: Medicare HMO | Attending: Neurology | Admitting: Occupational Therapy

## 2021-11-04 DIAGNOSIS — R2689 Other abnormalities of gait and mobility: Secondary | ICD-10-CM | POA: Insufficient documentation

## 2021-11-04 DIAGNOSIS — M6281 Muscle weakness (generalized): Secondary | ICD-10-CM

## 2021-11-04 DIAGNOSIS — R41840 Attention and concentration deficit: Secondary | ICD-10-CM | POA: Diagnosis not present

## 2021-11-04 DIAGNOSIS — G8929 Other chronic pain: Secondary | ICD-10-CM | POA: Diagnosis not present

## 2021-11-04 DIAGNOSIS — M25612 Stiffness of left shoulder, not elsewhere classified: Secondary | ICD-10-CM | POA: Diagnosis not present

## 2021-11-04 DIAGNOSIS — M25512 Pain in left shoulder: Secondary | ICD-10-CM | POA: Insufficient documentation

## 2021-11-04 DIAGNOSIS — M25622 Stiffness of left elbow, not elsewhere classified: Secondary | ICD-10-CM | POA: Diagnosis not present

## 2021-11-04 NOTE — Therapy (Signed)
OUTPATIENT PHYSICAL THERAPY NEURO TREATMENT-RECERT   Patient Name: Seth Lindsey MRN: [id number] DOB:Dec 07, 1965, 56 y.o., male Today's Date: 11/04/2021   PCP: Caleen Jobs, NP REFERRING PROVIDER: Britt Bottom, MD    PT End of Session - 11/04/21 1232     Visit Number 7    Number of Visits 17   recert   Date for PT Re-Evaluation 40/98/11   recert   Authorization Type [employer]    Progress Note Due on Visit 10    PT Start Time 1230    PT Stop Time 1312    PT Time Calculation (min) 42 min    Equipment Utilized During Treatment Gait belt    Activity Tolerance Patient tolerated treatment well    Behavior During Therapy WFL for tasks assessed/performed                   Past Medical History:  Diagnosis Date   Allergy    Anxiety    Depression    GERD (gastroesophageal reflux disease)    Hypertension    Multiple sclerosis ([place])    Past Surgical History:  Procedure Laterality Date   APPENDECTOMY     arm surgery     left- biceps reattached    CERVICAL FUSION     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   Procedure: REVERSE SHOULDER ARTHROPLASTY;  Surgeon: Hiram Gash, MD;  Location: [facility];  Service: Orthopedics;  Laterality: Left;   Patient Active Problem List   Diagnosis Date Noted   Primary hypertension 08/26/2021   Gastroesophageal reflux disease 08/26/2021   Tobacco abuse 08/26/2021   Insomnia 02/04/2021   Depression with anxiety 02/04/2021   High risk medication use 08/03/2020   Multiple sclerosis ([place]) 03/19/2020   Gait disturbance 03/19/2020   Spasticity 03/19/2020   Injury of left upper arm 03/19/2020    ONSET DATE: 09/06/21 date of referral  REFERRING DIAG: G35 (ICD-10-CM) - Multiple sclerosis ([place]) R25.2 (ICD-10-CM) - Spasticity R26.9 (ICD-10-CM) - Gait disturbance R20.8 (ICD-10-CM) - Dysesthesia S49.92XS (ICD-10-CM) - Injury of left upper arm, sequela   THERAPY DIAG:  Muscle weakness (generalized)  Other  abnormalities of gait and mobility  Rationale for Evaluation and Treatment Rehabilitation  SUBJECTIVE:                                                                                                                                                                                              SUBJECTIVE STATEMENT: Pt reports he finally has an appointment with the pain clinic on Oct 26th. Pt states he was was in too much pain last week so  had to cancel his therapy appointments. Pt reports he has been working on his HEP and that he feels good when he is moving in the pool (even his LUE) but then he feels the pain once he gets out of the pool.  Pt accompanied by:  Spouse  PERTINENT HISTORY: MS, L shoulder replacement  PAIN:  Are you having pain? Yes: NPRS scale: 9/10 Pain location: everywhere but LUE more than rest of his body Pain description: numbness with shooting and electrical pain, painful, throbbing Aggravating factors: lifting L arm, carrying Relieving factors: "sun"  PRECAUTIONS: Fall  FALLS: Has patient fallen in last 6 months? Yes. Number of falls 2x/week and multiple near falls per day  PATIENT GOALS Reduce falls, improve pain in my shoulder  OBJECTIVE:   TODAY'S TREATMENT:   GAIT: Gait pattern: trunk flexed and wide BOS Distance walked: 115 ft Assistive device utilized: Walker - 2 wheeled Level of assistance: SBA Comments: Decreased pain noted with gait this date, cues needed for upright posture to decrease trunk flexion. Pt exhibits increased gait speed and decreased rigidity of movements.   THER ACT: Reviewed HEP with patient this session to assess how he is currently doing with exercises. Removed seated crunches and standing BUE shoulder flexion from HEP, pt to continue with remainder of exercises.  Discussed pt's POC and recertification of PT services. Pt agreeable to extend his POC at this time (1x/week for 6 weeks) and will further discuss next session  following aquatic therapy if pt wishes to continue with PT services or be put on hold until after pain management appointment. Pt and his wife report they do see improvements in pt's function from therapy but also pt needs extended recovery time after therapy sessions due to ongoing pain.   PATIENT EDUCATION: Education details: continue HEP (adjusted, see below) Person educated: Patient, Spouse Education method: Explanation Education comprehension: verbalized understanding   HOME EXERCISE PROGRAM: Access Code: [email or web address] URL: [email or web address] Date: 09/22/2021 Prepared by: Excell Seltzer  Exercises - Sit to Stand with Armchair  - 1 x daily - 7 x weekly - 3 sets - 10 reps - Standing Balance with Eyes Open  - 1 x daily - 7 x weekly - 3 sets - 10 reps - Side Stepping with Counter Support  - 1 x daily - 7 x weekly - 3 sets - 10 reps - Backward Walking with RW Support  - 1 x daily - 7 x weekly - 3 sets - 10 reps - Alternating Step Taps with Counter/RW Support  - 1 x daily - 7 x weekly - 3 sets - 10 reps - Seated Thoracic Flexion and Rotation with Swiss Ball  - 1 x daily - 7 x weekly - 1 sets - 10 reps  -Standing trunk twists with contralateral UE reach: 5 x 10 R and L   GOALS: Goals reviewed with patient? Yes  SHORT TERM GOALS: Target date: 10/15/2021  Patient will be able to ambulate 230' with st. Cane without evidence of loss of balance to improve functional ambulation and reduce fall risk. Baseline:115' with 4 instances of LOB, 27' with RW no LOB Goal status: MET  2.  Patient will be able to use his L UE with functional transfers and also demo improved control with eccentric stand to sit to improve safe transfers. Baseline: Pt plops >90% of the with sitting, poor eccentric control with 10% of stand to sits Goal status: MET  3.  Pt will be able to go up and  down 8 stairs with use of rail/s and SBA to improve community accesss Baseline: TBD Goal status: NOT MET,  goal d/c due to unsafe to attempt   LONG TERM GOALS: Target date: 11/12/2021  Pt will demo 15 sec improvement in TUG score to improve functional mobility with appropriate AD Baseline: 54 sec with st. Cane, 29.75 with RW (8/15) Goal status: MET  2.  Patient will demo 0.50 m/s or better gait speed with appropriate AD to reduce fall risk and improve community abulation Baseline: 0.32 m/s with st. Cane, 0.56 m/s with RW (1.83 ft/s) (8/15) Goal status: MET  3.  Patient will demo 5 x sit to stand with bil UE support in <40 seconds to improve functional strength and safe transfers. Baseline: 53 seconds with  R UE support, 46 sec with RW (8/15) Goal status: IN PROGRESS  4.  Patient will demo 20 points of improvement in BBS to improve balance and reduce fall risk Baseline: 11/56 (7/26), 21/56 (8/18) Goal status: REVISED/UPGRADED   5.  Patient will demonstrate ability to navigate up/down one 6" curb with LRAD and Supervision in order to safely navigate his home environment Baseline: not assessed Goal status: INITIAL    ASSESSMENT:  CLINICAL IMPRESSION: Emphasis of skilled PT session on assessing pt's current HEP and discussing pt and wife's goals for PT POC. Removed seated crunches from HEP as pt has progressed beyond them and removed standing BUE shoulder flexion due to significant increase in pain with L shoulder mobility. Pt is able to perform all other exercises with HEP effectively. Pt will benefit from recertification of PT services (1x/week for additional 6 weeks) due to ongoing global weakness, impaired endurance, impaired balance, decreased safety and independence with functional mobility, and for pain management. Continue POC.   OBJECTIVE IMPAIRMENTS Abnormal gait, decreased activity tolerance, decreased balance, decreased endurance, decreased mobility, difficulty walking, decreased ROM, decreased strength, decreased safety awareness, increased fascial restrictions, impaired  perceived functional ability, impaired flexibility, impaired sensation, impaired tone, impaired UE functional use, improper body mechanics, postural dysfunction, and pain.   ACTIVITY LIMITATIONS carrying, lifting, bending, sitting, standing, squatting, stairs, transfers, bed mobility, bathing, toileting, dressing, hygiene/grooming, and caring for others  PARTICIPATION LIMITATIONS: meal prep, cleaning, laundry, driving, shopping, community activity, and yard work  PERSONAL FACTORS Past/current experiences, Time since onset of injury/illness/exacerbation, and 1-2 comorbidities: MS, L shoulder replacement  are also affecting patient's functional outcome.   REHAB POTENTIAL: Good  CLINICAL DECISION MAKING: Stable/uncomplicated  EVALUATION COMPLEXITY: Low  PLAN:     PT FREQUENCY: 1x/week  PT DURATION: 8 weeks+6 weeks (recert)  PLANNED INTERVENTIONS: Therapeutic exercises, Therapeutic activity, Neuromuscular re-education, Balance training, Gait training, Patient/Family education, Self Care, Joint mobilization, Stair training, Orthotic/Fit training, Aquatic Therapy, Wheelchair mobility training, Spinal mobilization, Cryotherapy, Moist heat, Manual therapy, and Re-evaluation  PLAN FOR NEXT SESSION: gumdrop taps, work on static standing balance with decreased UE support, LE (L>R) strengthening, core strengthening, stretching (anterior leans on therapy ball?), gait with LRAD, discuss energy conservation, does pt want to continue with PT or be put on hold until after pain management clinic appointment?       Excell Seltzer, PT, DPT, CSRS 11/04/2021, 4:22 PM

## 2021-11-04 NOTE — Therapy (Signed)
OUTPATIENT OCCUPATIONAL THERAPY NEURO Treatment  Patient Name: Seth Lindsey MRN: [id number] DOB:[birth year], 56 y.o., male Today's Date: 11/04/2021  PCP: Dr. Reola Calkins REFERRING PROVIDER: Dr. Epimenio Foot   OT End of Session - 11/04/21 1413     Visit Number 3    Date for OT Re-Evaluation 01/07/22    Authorization Type [employer]    Authorization - Visit Number 3    Authorization - Number of Visits 10    OT Start Time 1315    OT Stop Time 1400    OT Time Calculation (min) 45 min    Equipment Utilized During Treatment Theracane    Activity Tolerance Patient limited by pain    Behavior During Therapy WFL for tasks assessed/performed              Past Medical History:  Diagnosis Date   Allergy    Anxiety    Depression    GERD (gastroesophageal reflux disease)    Hypertension    Multiple sclerosis ([facility])    Past Surgical History:  Procedure Laterality Date   APPENDECTOMY     arm surgery     left- biceps reattached    CERVICAL FUSION     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   Procedure: REVERSE SHOULDER ARTHROPLASTY;  Surgeon: Bjorn Pippin, MD;  Location: [facility];  Service: Orthopedics;  Laterality: Left;   Patient Active Problem List   Diagnosis Date Noted   Primary hypertension 08/26/2021   Gastroesophageal reflux disease 08/26/2021   Tobacco abuse 08/26/2021   Insomnia 02/04/2021   Depression with anxiety 02/04/2021   High risk medication use 08/03/2020   Multiple sclerosis ([facility]) 03/19/2020   Gait disturbance 03/19/2020   Spasticity 03/19/2020   Injury of left upper arm 03/19/2020    ONSET DATE: 09/20/21  REFERRING DIAG: M25.512 (ICD-10-CM) - Left shoulder pain, unspecified G35 (ICD-10-CM) - Multiple sclerosis ([facility])  THERAPY DIAG:  Stiffness of left elbow, not elsewhere classified  Stiffness of left shoulder, not elsewhere classified  Muscle weakness (generalized)  Chronic left shoulder pain  Attention and concentration  deficit  Rationale for Evaluation and Treatment Rehabilitation  SUBJECTIVE:   SUBJECTIVE STATEMENT: Pt reports ongoing  Left shoulder and arm pain Pt accompanied by: significant other fiancee  PERTINENT HISTORY: MS, Pt had accident where he tore his L biceps tendon and they had to reattach it and also had broken bone fragments., he waited 6 mons for surgery due to Covid. He had L shoulder replacement last September and he had PT afterwards. PMH: HTN, hx of cervical fusion, HTN, ADHD, dyslexia  PRECAUTIONS: Fall, hx of L shoulder replacement Sept. 2022, hx of biceps tear LUE, cervical fusion  WEIGHT BEARING RESTRICTIONS No  PAIN:  Are you having pain? Yes: NPRS scale: 9/10 Pain location: left shoulder and generalized Pain description: aching Aggravating factors: reaching Relieving factors: rest  FALLS: Has patient fallen in last 6 months? Yes. Number of falls multiple  LIVING ENVIRONMENT: Lives with: lives with their spouse(fiancee) Lives in: House/apartment  Has following equipment at home: Single point cane and up walker  PLOF: Independent with basic ADLs and Independent with gait  PATIENT GOALS improved LUE function  OBJECTIVE:    OBSERVATIONS: Pt demonstrates significant pain today    TODAY'S TREATMENT:  11/04/21 Reviewed aquatic therapy instruction.  Patient reports he goes to his apartment complex pool daily and he feels great while in the water, but then he "pays the price" later with muscle pain / spasm.  Reviewed  sleeping positions.  Patient typically sleeps on right side, and has pain if he rolls to his back.  Patient reports that he is comfortable in recliner (decreases LUE pain)  Had patient transition slowly to reclined supine on wedge with left elbow supported.  Worked first on gentle body movement  - very gentle rocking of trunk over stable left arm.  Worked on minute lateral flexion in neck toward left shoulder.  Worked to increase ability to relax into  surface.  Every new movement - even subtle set off brief but intense muscle spasm.  Encouraged breathing to reduce tension during spasm - and this was helpful at times.  Worked on deep tissue mobilization in posterior / superior left shoulder.  Sustained pressure helpful to reduce pain.    Followed with myofascial release in upper quadrant (upper trap, supraspinatus region) Patient reports slight decrease in pain.   Demonstrated theracane and let patient try on his own.  Patient felt this offered pain relief.  Planning to order one for personal use.       HOME EXERCISE PROGRAM: N/A    GOALS: Goals reviewed with patient? No  SHORT TERM GOALS: Target date: 11/14/21  I with initial HEP. Baseline: Goal status: INITIAL  2.  I with pain reduction strategies and positioning for LUE. Baseline:  Goal status: INITIAL  3.  I with energy conservation techniques. Baseline:  Goal status: INITIAL  4.  I with adapted equipment/ adapted strategies to increase I with ADLS/IADLS(ie: tub transfer bench, reacher for LB dressing) Baseline:  Goal status: INITIAL  5.  Pt will demonstrate ability to retrieve a lightweight object at 85* with pain no greater than 6/10. Baseline: 75*, pain 9/10 Goal status: INITIAL  6.  Test box and blocks and set goal Baseline:  Goal status: INITIAL  LONG TERM GOALS: Target date: 01/07/22  I with updated HEP. Baseline:  Goal status: INITIAL  2.  I with memory compensation strategies. Baseline:  Goal status: INITIAL  3.  Pt will demonstrate improved fine motor coordination  for ADLs as evidenced by decreasing 9 hole peg test score to 52 secs or less. Baseline: 62 secs Goal status: INITIAL  4.  Pt will increase LUE grip strength to 30 lbs or greater for increased functional use. Baseline: 24.6 Goal status: INITIAL  5.  Pt will perform LB dressing modified independently. Baseline: dependent for shoes and socks Goal status: INITIAL  6. Pt will report  using LUE as a non dominant assist for ADLS at least 50% of the time. Baseline: not using consistently Goal status: INITIAL  ASSESSMENT:  CLINICAL IMPRESSION: Pt with significant pain at start of session.  Patient with significant muscle spasms throughout session.  Reviewed medications for muscle tone / spasm management.  Patient has appointment with pain management clinic ([facility]) at end of October.    PERFORMANCE DEFICITS in functional skills including ADLs, IADLs, coordination, dexterity, sensation, ROM, strength, pain, flexibility, FMC, GMC, balance, decreased knowledge of precautions, decreased knowledge of use of DME, and UE functional use, cognitive skills including attention, learn, memory, problem solving, safety awareness, sequencing, thought, and understand, and psychosocial skills including coping strategies, environmental adaptation, habits, interpersonal interactions, and routines and behaviors.   IMPAIRMENTS are limiting patient from ADLs, IADLs, rest and sleep, play, leisure, and social participation.   COMORBIDITIES may have co-morbidities  that affects occupational performance. Patient will benefit from skilled OT to address above impairments and improve overall function.  MODIFICATION OR ASSISTANCE TO COMPLETE EVALUATION: Min-Moderate  modification of tasks or assist with assess necessary to complete an evaluation.  OT OCCUPATIONAL PROFILE AND HISTORY: Detailed assessment: Review of records and additional review of physical, cognitive, psychosocial history related to current functional performance.  CLINICAL DECISION MAKING: LOW - limited treatment options, no task modification necessary  REHAB POTENTIAL: Good  EVALUATION COMPLEXITY: Low    PLAN: OT FREQUENCY: 1x/week  OT DURATION: 12 weeks plus eval  PLANNED INTERVENTIONS: self care/ADL training, therapeutic exercise, therapeutic activity, neuromuscular re-education, manual therapy, passive range of motion, balance  training, functional mobility training, electrical stimulation, ultrasound, paraffin, fluidotherapy, moist heat, cryotherapy, patient/family education, cognitive remediation/compensation, visual/perceptual remediation/compensation, energy conservation, coping strategies training, DME and/or AE instructions, and Re-evaluation  RECOMMENDED OTHER SERVICES: PT  CONSULTED AND AGREED WITH PLAN OF CARE: Patient and family member/caregiver  PLAN FOR NEXT SESSION:  pain reduction strategies, consider aquatic therapy, possible referral to pain management?   Collier Salina, OT 11/04/2021, 2:15 PM

## 2021-11-04 NOTE — Patient Instructions (Signed)
   Aquatic Therapy: What to Expect!  Where:  [facility] [address] [place], [place] [address] [phone]           How to Prepare: Please make sure you drink 8 ounces of water about one hour prior to your pool session A caregiver must attend the entire session with the patient (unless your primary therapists feels this is not necessary). The caregiver will be responsible for assisting with dressing as well as any toileting needs.  Please arrive IN YOUR SUIT and a few minutes prior to your appointment - this helps to avoid delays in starting your session. Please make sure to attend to any toileting needs prior to entering the pool Once on the pool deck your therapist will ask you to sign the Patient  Consent and Assignment of Benefits form Your therapist may take your blood pressure prior to, during and after your session if indicated We usually try and create a home exercise program based on activities we do in the pool.  Please be thinking about who might be able to assist you in the pool should you want to participate in an aquatic home exercise program at the time of discharge.  Some patients do not want to or do not have the ability to participate in an aquatic home program - this is not a barrier in any way to you participating in aquatic therapy as part of your current therapy plan!    About the pool: Entering the pool Your therapist will assist you; there are multiple ways to enter including stairs with railings, a walk in ramp, a roll in chair and a mechanical lift. Your therapist will determine the most appropriate way for you. Water temperature is usually between 86-87 degrees There may be other swimmers in the pool at the same time     Contact Info:             Appointments: [facility]         All sessions are 45 minutes   [address]  [address]            Please call the [facility]  if   [place], [place]  [address]           you need to cancel or reschedule an appointment.  [phone], PT    [place], OTR/L [place] [phone]   08/21/19

## 2021-11-08 ENCOUNTER — Encounter: Payer: Self-pay | Admitting: Occupational Therapy

## 2021-11-08 ENCOUNTER — Ambulatory Visit: Payer: Medicare HMO | Admitting: Occupational Therapy

## 2021-11-08 DIAGNOSIS — M25612 Stiffness of left shoulder, not elsewhere classified: Secondary | ICD-10-CM

## 2021-11-08 DIAGNOSIS — M25622 Stiffness of left elbow, not elsewhere classified: Secondary | ICD-10-CM | POA: Diagnosis not present

## 2021-11-08 DIAGNOSIS — R41840 Attention and concentration deficit: Secondary | ICD-10-CM | POA: Diagnosis not present

## 2021-11-08 DIAGNOSIS — F331 Major depressive disorder, recurrent, moderate: Secondary | ICD-10-CM | POA: Diagnosis not present

## 2021-11-08 DIAGNOSIS — M25512 Pain in left shoulder: Secondary | ICD-10-CM | POA: Diagnosis not present

## 2021-11-08 DIAGNOSIS — G8929 Other chronic pain: Secondary | ICD-10-CM | POA: Diagnosis not present

## 2021-11-08 DIAGNOSIS — M6281 Muscle weakness (generalized): Secondary | ICD-10-CM | POA: Diagnosis not present

## 2021-11-08 DIAGNOSIS — F411 Generalized anxiety disorder: Secondary | ICD-10-CM | POA: Diagnosis not present

## 2021-11-08 DIAGNOSIS — F4312 Post-traumatic stress disorder, chronic: Secondary | ICD-10-CM | POA: Diagnosis not present

## 2021-11-08 DIAGNOSIS — R2689 Other abnormalities of gait and mobility: Secondary | ICD-10-CM | POA: Diagnosis not present

## 2021-11-08 NOTE — Therapy (Signed)
OUTPATIENT OCCUPATIONAL THERAPY NEURO Treatment  Patient Name: Seth Lindsey MRN: [id number] DOB:Oct 01, 1965, 56 y.o., male Today's Date: 11/08/2021  PCP: Dr. Reola Calkins REFERRING PROVIDER: Dr. Epimenio Foot   OT End of Session - 11/08/21 1732     Visit Number 4    Number of Visits 13    Date for OT Re-Evaluation 01/07/22    Authorization Type [employer]    Authorization - Visit Number 4    Authorization - Number of Visits 10    OT Start Time 1330    OT Stop Time 1410    OT Time Calculation (min) 40 min    Equipment Utilized During Treatment floatation equipment    Activity Tolerance Patient tolerated treatment well    Behavior During Therapy WFL for tasks assessed/performed              Past Medical History:  Diagnosis Date   Allergy    Anxiety    Depression    GERD (gastroesophageal reflux disease)    Hypertension    Multiple sclerosis (HCC)    Past Surgical History:  Procedure Laterality Date   APPENDECTOMY     arm surgery     left- biceps reattached    CERVICAL FUSION     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   Procedure: REVERSE SHOULDER ARTHROPLASTY;  Surgeon: Bjorn Pippin, MD;  Location: [facility];  Service: Orthopedics;  Laterality: Left;   Patient Active Problem List   Diagnosis Date Noted   Primary hypertension 08/26/2021   Gastroesophageal reflux disease 08/26/2021   Tobacco abuse 08/26/2021   Insomnia 02/04/2021   Depression with anxiety 02/04/2021   High risk medication use 08/03/2020   Multiple sclerosis (HCC) 03/19/2020   Gait disturbance 03/19/2020   Spasticity 03/19/2020   Injury of left upper arm 03/19/2020    ONSET DATE: 09/20/21  REFERRING DIAG: M25.512 (ICD-10-CM) - Left shoulder pain, unspecified G35 (ICD-10-CM) - Multiple sclerosis (HCC)  THERAPY DIAG:  Muscle weakness (generalized)  Stiffness of left elbow, not elsewhere classified  Stiffness of left shoulder, not elsewhere classified  Chronic left shoulder  pain  Rationale for Evaluation and Treatment Rehabilitation  SUBJECTIVE:   SUBJECTIVE STATEMENT: Pt reports ongoing  Left shoulder and arm pain Pt accompanied by: significant other fiancee  PERTINENT HISTORY: MS, Pt had accident where he tore his L biceps tendon and they had to reattach it and also had broken bone fragments., he waited 6 mons for surgery due to Covid. He had L shoulder replacement last September and he had PT afterwards. PMH: HTN, hx of cervical fusion, HTN, ADHD, dyslexia  PRECAUTIONS: Fall, hx of L shoulder replacement Sept. 2022, hx of biceps tear LUE, cervical fusion  WEIGHT BEARING RESTRICTIONS No  PAIN:  Are you having pain? Yes: NPRS scale: 9/10 Pain location: left shoulder and generalized Pain description: aching Aggravating factors: reaching Relieving factors: rest  FALLS: Has patient fallen in last 6 months? Yes. Number of falls multiple  LIVING ENVIRONMENT: Lives with: lives with their spouse(fiancee) Lives in: House/apartment  Has following equipment at home: Single point cane and up walker  PLOF: Independent with basic ADLs and Independent with gait  PATIENT GOALS improved LUE function  OBJECTIVE:    OBSERVATIONS: Pt present for first aquatic therapy visit   TODAY'S TREATMENT:  11/08/21 Patient seen for first aquatic therapy session.  Patient entered and exited the pool via stairs and bilateral railings and min assist.  Patient reporting pain 9/10 at start of session.  Patient  then breaks down pain as a "good 9 or a sharp 9"  Patient joking and smiling throughout session.  Started session while seated on underwater bench.  Worked on allowing buoyancy to float his arms to surface, versus lifting arms up.  Patient indicates throughout the session - "this feels so good - I cannot believe there is not pain..."  But when asked to objectively score pain - patient indicates 9/10.   Worked on gentle active movement of LUE - Shoulder, elbow, forearm,  and wrist.  Passive floatation prone - to increase shoulder flexion stretch.  Worked on body on arm motion in upright and in supine positions.   Patient pleased with performance and feels aquatic therapy is helpful in allowing him to move with less muscle spasm.    HOME EXERCISE PROGRAM: N/A    GOALS: Goals reviewed with patient? No  SHORT TERM GOALS: Target date: 11/14/21  I with initial HEP. Baseline: Goal status: INITIAL  2.  I with pain reduction strategies and positioning for LUE. Baseline:  Goal status: INITIAL  3.  I with energy conservation techniques. Baseline:  Goal status: INITIAL  4.  I with adapted equipment/ adapted strategies to increase I with ADLS/IADLS(ie: tub transfer bench, reacher for LB dressing) Baseline:  Goal status: INITIAL  5.  Pt will demonstrate ability to retrieve a lightweight object at 85* with pain no greater than 6/10. Baseline: 75*, pain 9/10 Goal status: INITIAL  6.  Test box and blocks and set goal Baseline:  Goal status: INITIAL  LONG TERM GOALS: Target date: 01/07/22  I with updated HEP. Baseline:  Goal status: INITIAL  2.  I with memory compensation strategies. Baseline:  Goal status: INITIAL  3.  Pt will demonstrate improved fine motor coordination  for ADLs as evidenced by decreasing 9 hole peg test score to 52 secs or less. Baseline: 62 secs Goal status: INITIAL  4.  Pt will increase LUE grip strength to 30 lbs or greater for increased functional use. Baseline: 24.6 Goal status: INITIAL  5.  Pt will perform LB dressing modified independently. Baseline: dependent for shoes and socks Goal status: INITIAL  6. Pt will report using LUE as a non dominant assist for ADLS at least 50% of the time. Baseline: not using consistently Goal status: INITIAL  ASSESSMENT:  CLINICAL IMPRESSION: Pt requesting to discontinue clinic based treatments for aquatic therapy visits at this time as he has little tolerance for land based  treatment for his LUE, and pain is diminished in the water.     PERFORMANCE DEFICITS in functional skills including ADLs, IADLs, coordination, dexterity, sensation, ROM, strength, pain, flexibility, FMC, GMC, balance, decreased knowledge of precautions, decreased knowledge of use of DME, and UE functional use, cognitive skills including attention, learn, memory, problem solving, safety awareness, sequencing, thought, and understand, and psychosocial skills including coping strategies, environmental adaptation, habits, interpersonal interactions, and routines and behaviors.   IMPAIRMENTS are limiting patient from ADLs, IADLs, rest and sleep, play, leisure, and social participation.   COMORBIDITIES may have co-morbidities  that affects occupational performance. Patient will benefit from skilled OT to address above impairments and improve overall function.  MODIFICATION OR ASSISTANCE TO COMPLETE EVALUATION: Min-Moderate modification of tasks or assist with assess necessary to complete an evaluation.  OT OCCUPATIONAL PROFILE AND HISTORY: Detailed assessment: Review of records and additional review of physical, cognitive, psychosocial history related to current functional performance.  CLINICAL DECISION MAKING: LOW - limited treatment options, no task modification necessary  REHAB  POTENTIAL: Good  EVALUATION COMPLEXITY: Low    PLAN: OT FREQUENCY: 1x/week  OT DURATION: 12 weeks plus eval  PLANNED INTERVENTIONS: self care/ADL training, therapeutic exercise, therapeutic activity, neuromuscular re-education, manual therapy, passive range of motion, balance training, functional mobility training, electrical stimulation, ultrasound, paraffin, fluidotherapy, moist heat, cryotherapy, patient/family education, cognitive remediation/compensation, visual/perceptual remediation/compensation, energy conservation, coping strategies training, DME and/or AE instructions, and Re-evaluation  RECOMMENDED OTHER  SERVICES: PT  CONSULTED AND AGREED WITH PLAN OF CARE: Patient and family member/caregiver  PLAN FOR NEXT SESSION:  pain reduction strategies, aquatic therapy, possible referral to pain management?   Collier Salina, OT 11/08/2021, 5:34 PM

## 2021-11-09 ENCOUNTER — Ambulatory Visit: Payer: Medicare HMO | Admitting: Physical Therapy

## 2021-11-09 ENCOUNTER — Encounter: Payer: Medicare HMO | Admitting: Acute Care

## 2021-11-10 ENCOUNTER — Ambulatory Visit: Payer: Medicare HMO | Admitting: Physical Therapy

## 2021-11-10 ENCOUNTER — Ambulatory Visit: Payer: Medicare HMO | Admitting: Occupational Therapy

## 2021-11-11 ENCOUNTER — Encounter: Payer: Medicare HMO | Admitting: Occupational Therapy

## 2021-11-12 ENCOUNTER — Other Ambulatory Visit: Payer: Medicare HMO

## 2021-11-15 ENCOUNTER — Encounter: Payer: Self-pay | Admitting: Occupational Therapy

## 2021-11-15 ENCOUNTER — Ambulatory Visit: Payer: Self-pay | Admitting: Occupational Therapy

## 2021-11-15 DIAGNOSIS — M25612 Stiffness of left shoulder, not elsewhere classified: Secondary | ICD-10-CM

## 2021-11-15 DIAGNOSIS — M25622 Stiffness of left elbow, not elsewhere classified: Secondary | ICD-10-CM | POA: Diagnosis not present

## 2021-11-15 DIAGNOSIS — G8929 Other chronic pain: Secondary | ICD-10-CM

## 2021-11-15 DIAGNOSIS — R41840 Attention and concentration deficit: Secondary | ICD-10-CM | POA: Diagnosis not present

## 2021-11-15 DIAGNOSIS — M25512 Pain in left shoulder: Secondary | ICD-10-CM | POA: Diagnosis not present

## 2021-11-15 DIAGNOSIS — M6281 Muscle weakness (generalized): Secondary | ICD-10-CM | POA: Diagnosis not present

## 2021-11-15 DIAGNOSIS — R2689 Other abnormalities of gait and mobility: Secondary | ICD-10-CM | POA: Diagnosis not present

## 2021-11-15 NOTE — Therapy (Signed)
OUTPATIENT OCCUPATIONAL THERAPY NEURO Treatment  Patient Name: Seth Lindsey MRN: [id number] DOB:07-Jun-1965, 56 y.o., male Today's Date: 11/15/2021  PCP: Dr. Olevia Bowens REFERRING PROVIDER: Dr. Felecia Shelling   OT End of Session - 11/15/21 1909     Visit Number 5    Number of Visits 13    Date for OT Re-Evaluation 01/07/22    Authorization Type [employer]    Authorization - Visit Number 5    Authorization - Number of Visits 10    OT Start Time 1332    OT Stop Time 1415    OT Time Calculation (min) 43 min    Equipment Utilized During Treatment floatation equipment    Activity Tolerance Patient tolerated treatment well    Behavior During Therapy WFL for tasks assessed/performed              Past Medical History:  Diagnosis Date   Allergy    Anxiety    Depression    GERD (gastroesophageal reflux disease)    Hypertension    Multiple sclerosis ([place])    Past Surgical History:  Procedure Laterality Date   APPENDECTOMY     arm surgery     left- biceps reattached    CERVICAL FUSION     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   Procedure: REVERSE SHOULDER ARTHROPLASTY;  Surgeon: Hiram Gash, MD;  Location: [facility];  Service: Orthopedics;  Laterality: Left;   Patient Active Problem List   Diagnosis Date Noted   Primary hypertension 08/26/2021   Gastroesophageal reflux disease 08/26/2021   Tobacco abuse 08/26/2021   Insomnia 02/04/2021   Depression with anxiety 02/04/2021   High risk medication use 08/03/2020   Multiple sclerosis ([place]) 03/19/2020   Gait disturbance 03/19/2020   Spasticity 03/19/2020   Injury of left upper arm 03/19/2020    ONSET DATE: 09/20/21  REFERRING DIAG: M25.512 (ICD-10-CM) - Left shoulder pain, unspecified G35 (ICD-10-CM) - Multiple sclerosis ([place])  THERAPY DIAG:  Muscle weakness (generalized)  Stiffness of left elbow, not elsewhere classified  Stiffness of left shoulder, not elsewhere classified  Chronic left shoulder  pain  Rationale for Evaluation and Treatment Rehabilitation  SUBJECTIVE:   SUBJECTIVE STATEMENT: Pt reports ongoing  Left shoulder and arm pain Pt accompanied by: significant other fiancee  PERTINENT HISTORY: MS, Pt had accident where he tore his L biceps tendon and they had to reattach it and also had broken bone fragments., he waited 6 mons for surgery due to Covid. He had L shoulder replacement last September and he had PT afterwards. PMH: HTN, hx of cervical fusion, HTN, ADHD, dyslexia  PRECAUTIONS: Fall, hx of L shoulder replacement Sept. 2022, hx of biceps tear LUE, cervical fusion  WEIGHT BEARING RESTRICTIONS No  PAIN:  Are you having pain? Yes: NPRS scale: 9/10 Pain location: left shoulder and generalized Pain description: aching Aggravating factors: reaching Relieving factors: rest  FALLS: Has patient fallen in last 6 months? Yes. Number of falls multiple  LIVING ENVIRONMENT: Lives with: lives with their spouse(fiancee) Lives in: House/apartment  Has following equipment at home: Single point cane and up walker  PLOF: Independent with basic ADLs and Independent with gait  PATIENT GOALS improved LUE function  OBJECTIVE:    OBSERVATIONS: Pt present for first aquatic therapy visit   TODAY'S TREATMENT:  11/15/21 Patient seen for aquatic therapy session.  Patient entered and exited the pool via stairs and bilateral railings and cueing for step through pattern, progressing left hand on railing.  Patient reporting pain  8/10 at start of session.  Patient then breaks down pain as a "gentle 8 or a strong 8"   Session took place in 3.5-4.5 ft of warm water.  Started session with water walking and allowing arms to gently swing at sides.  Patient reports pain is improving outside of water environment, and is using theracane to work out shoulder pain.   Soft tissue mobilization under water to bicep insertion and along forearm to help improve passive to active supination,   Patient needs cueing to reduce overactive muscle guarding in forearm, wrist and hand.   Worked on prone float - float with abduction, and light weight training with resistance in all directions.   Supine floatation to address shoulder mechanics and passive range as well as body on arm motion in ranges that did not elicit muscle spasm.  Patient pleased with improvement.    HOME EXERCISE PROGRAM: N/A    GOALS: Goals reviewed with patient? No  SHORT TERM GOALS: Target date: 11/14/21  I with initial HEP. Baseline: Goal status: IN PROGRESS  2.  I with pain reduction strategies and positioning for LUE. Baseline:  Goal status: IN PROGRESS  3.  I with energy conservation techniques. Baseline:  Goal status: IN PROGRESS  4.  I with adapted equipment/ adapted strategies to increase I with ADLS/IADLS(ie: tub transfer bench, reacher for LB dressing) Baseline:  Goal status: IN PROGRESS  5.  Pt will demonstrate ability to retrieve a lightweight object at 85* with pain no greater than 6/10. Baseline: 75*, pain 9/10 Goal status: IN PROGRESS  6.  Test box and blocks and set goal Baseline:  Goal status: REVISED - NA  LONG TERM GOALS: Target date: 01/07/22  I with updated HEP. Baseline:  Goal status: INITIAL  2.  I with memory compensation strategies. Baseline:  Goal status: INITIAL  3.  Pt will demonstrate improved fine motor coordination  for ADLs as evidenced by decreasing 9 hole peg test score to 52 secs or less. Baseline: 62 secs Goal status: INITIAL  4.  Pt will increase LUE grip strength to 30 lbs or greater for increased functional use. Baseline: 24.6 Goal status: INITIAL  5.  Pt will perform LB dressing modified independently. Baseline: dependent for shoes and socks Goal status: INITIAL  6. Pt will report using LUE as a non dominant assist for ADLS at least [id number] of the time. Baseline: not using consistently Goal status: INITIAL  ASSESSMENT:  CLINICAL  IMPRESSION: Pt is showing some small improvement in report of pain in LUE.  He is showing consistent improvement in active range of motion and muscle balance in LUE - especially distally.    PERFORMANCE DEFICITS in functional skills including ADLs, IADLs, coordination, dexterity, sensation, ROM, strength, pain, flexibility, FMC, GMC, balance, decreased knowledge of precautions, decreased knowledge of use of DME, and UE functional use, cognitive skills including attention, learn, memory, problem solving, safety awareness, sequencing, thought, and understand, and psychosocial skills including coping strategies, environmental adaptation, habits, interpersonal interactions, and routines and behaviors.   IMPAIRMENTS are limiting patient from ADLs, IADLs, rest and sleep, play, leisure, and social participation.   COMORBIDITIES may have co-morbidities  that affects occupational performance. Patient will benefit from skilled OT to address above impairments and improve overall function.  MODIFICATION OR ASSISTANCE TO COMPLETE EVALUATION: Min-Moderate modification of tasks or assist with assess necessary to complete an evaluation.  OT OCCUPATIONAL PROFILE AND HISTORY: Detailed assessment: Review of records and additional review of physical, cognitive, psychosocial history related  to current functional performance.  CLINICAL DECISION MAKING: LOW - limited treatment options, no task modification necessary  REHAB POTENTIAL: Good  EVALUATION COMPLEXITY: Low    PLAN: OT FREQUENCY: 1x/week  OT DURATION: 12 weeks plus eval  PLANNED INTERVENTIONS: self care/ADL training, therapeutic exercise, therapeutic activity, neuromuscular re-education, manual therapy, passive range of motion, balance training, functional mobility training, electrical stimulation, ultrasound, paraffin, fluidotherapy, moist heat, cryotherapy, patient/family education, cognitive remediation/compensation, visual/perceptual  remediation/compensation, energy conservation, coping strategies training, DME and/or AE instructions, and Re-evaluation  RECOMMENDED OTHER SERVICES: PT  CONSULTED AND AGREED WITH PLAN OF CARE: Patient and family member/caregiver  PLAN FOR NEXT SESSION:  pain reduction strategies, aquatic therapy   Mariah Milling, OT 11/15/2021, 7:12 PM

## 2021-11-16 ENCOUNTER — Encounter: Payer: Medicare HMO | Admitting: Occupational Therapy

## 2021-11-16 ENCOUNTER — Ambulatory Visit: Payer: Medicare HMO | Admitting: Physical Therapy

## 2021-11-16 DIAGNOSIS — M25622 Stiffness of left elbow, not elsewhere classified: Secondary | ICD-10-CM | POA: Diagnosis not present

## 2021-11-16 DIAGNOSIS — R2689 Other abnormalities of gait and mobility: Secondary | ICD-10-CM

## 2021-11-16 DIAGNOSIS — M6281 Muscle weakness (generalized): Secondary | ICD-10-CM | POA: Diagnosis not present

## 2021-11-16 DIAGNOSIS — R41840 Attention and concentration deficit: Secondary | ICD-10-CM | POA: Diagnosis not present

## 2021-11-16 DIAGNOSIS — M25612 Stiffness of left shoulder, not elsewhere classified: Secondary | ICD-10-CM | POA: Diagnosis not present

## 2021-11-16 DIAGNOSIS — G8929 Other chronic pain: Secondary | ICD-10-CM | POA: Diagnosis not present

## 2021-11-16 DIAGNOSIS — M25512 Pain in left shoulder: Secondary | ICD-10-CM | POA: Diagnosis not present

## 2021-11-16 NOTE — Therapy (Signed)
OUTPATIENT PHYSICAL THERAPY NEURO TREATMENT-DISCHARGE   Patient Name: Seth Lindsey MRN: [id number] DOB:1965/12/01, 56 y.o., male Today's Date: 11/16/2021   PCP: Caleen Jobs, NP REFERRING PROVIDER: Britt Bottom, MD   PHYSICAL THERAPY DISCHARGE SUMMARY  Visits from Start of Care: 8  Current functional level related to goals / functional outcomes: Mod I overall for gait with RW, min A for curb navigation with SPC   Remaining deficits: Decreased independence with curb navigation   Education / Equipment: Continue HEP (handout provided), continue use of RW for majority of mobility and SPC for curb navigation   Patient agrees to discharge. Patient goals were partially met. Patient is being discharged due to being pleased with the current functional level.    PT End of Session - 11/16/21 1243     Visit Number 8    Number of Visits 17   recert   Date for PT Re-Evaluation 78/29/56   recert   Authorization Type [employer]    Progress Note Due on Visit 10    PT Start Time 1152   therapist ran late with previous patient   PT Stop Time 1231   discharge   PT Time Calculation (min) 39 min    Equipment Utilized During Treatment Gait belt    Activity Tolerance Patient tolerated treatment well    Behavior During Therapy WFL for tasks assessed/performed                    Past Medical History:  Diagnosis Date   Allergy    Anxiety    Depression    GERD (gastroesophageal reflux disease)    Hypertension    Multiple sclerosis ([place])    Past Surgical History:  Procedure Laterality Date   APPENDECTOMY     arm surgery     left- biceps reattached    CERVICAL FUSION     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   Procedure: REVERSE SHOULDER ARTHROPLASTY;  Surgeon: Hiram Gash, MD;  Location: [facility];  Service: Orthopedics;  Laterality: Left;   Patient Active Problem List   Diagnosis Date Noted   Primary hypertension 08/26/2021    Gastroesophageal reflux disease 08/26/2021   Tobacco abuse 08/26/2021   Insomnia 02/04/2021   Depression with anxiety 02/04/2021   High risk medication use 08/03/2020   Multiple sclerosis ([place]) 03/19/2020   Gait disturbance 03/19/2020   Spasticity 03/19/2020   Injury of left upper arm 03/19/2020    ONSET DATE: 09/06/21 date of referral  REFERRING DIAG: G35 (ICD-10-CM) - Multiple sclerosis ([place]) R25.2 (ICD-10-CM) - Spasticity R26.9 (ICD-10-CM) - Gait disturbance R20.8 (ICD-10-CM) - Dysesthesia S49.92XS (ICD-10-CM) - Injury of left upper arm, sequela   THERAPY DIAG:  Muscle weakness (generalized)  Other abnormalities of gait and mobility  Rationale for Evaluation and Treatment Rehabilitation  SUBJECTIVE:  SUBJECTIVE STATEMENT: Pt reports he would like to d/c from PT after this session due to feeling that his functional mobility has improved and his pain has improved. Pt  able to stand from EOM with no UE support at mod I level. Gait x 230 ft around therapy gym with no AD and CGA for balnce  Pt accompanied by:  Spouse  PERTINENT HISTORY: MS, L shoulder replacement  PAIN:  Are you having pain? Yes: NPRS scale: 8/10 Pain location: everywhere but LUE more than rest of his body Pain description: numbness with shooting and electrical pain, painful, throbbing Aggravating factors: lifting L arm, carrying Relieving factors: "sun"  PRECAUTIONS: Fall  FALLS: Has patient fallen in last 6 months? Yes. Number of falls 2x/week and multiple near falls per day  PATIENT GOALS Reduce falls, improve pain in my shoulder  OBJECTIVE:   TODAY'S TREATMENT:   GAIT: Gait pattern: decreased arm swing- Right, decreased arm swing- Left, and wide BOS Distance walked: 115 ft Assistive device utilized: None Level  of assistance: CGA Comments: Decreased pain and improved balance noted with gait with no AD this date. Pt does exhibit decreased B arm swing and rigid trunk with gait as well as wide BOS but no LOB noted and no increase in pain with gait.   CURB:  Level of Assistance: Min A Assistive device utilized: Single point cane Curb Comments: decreased safety when descending curb as compared to ascending curb   THER ACT: Sit to stand from mat table with no UE support x 5 reps this date with no increase in pain at mod I level (increased time needed).   [facility] PT Assessment - 11/16/21 1209       Standardized Balance Assessment   Standardized Balance Assessment Berg Balance Test;Five Times Sit to Stand    Five times sit to stand comments  24.06 sec      Berg Balance Test   Sit to Stand Able to stand without using hands and stabilize independently    Standing Unsupported Able to stand safely 2 minutes    Sitting with Back Unsupported but Feet Supported on Floor or Stool Able to sit safely and securely 2 minutes    Stand to Sit Sits safely with minimal use of hands    Transfers Able to transfer safely, definite need of hands    Standing Unsupported with Eyes Closed Able to stand 10 seconds with supervision    Standing Unsupported with Feet Together Able to place feet together independently and stand for 1 minute with supervision    From Standing, Reach Forward with Outstretched Arm Reaches forward but needs supervision    From Standing Position, Pick up Object from Floor Unable to try/needs assist to keep balance    From Standing Position, Turn to Look Behind Over each Shoulder Turn sideways only but maintains balance    Turn 360 Degrees Able to turn 360 degrees safely but slowly    Standing Unsupported, Alternately Place Feet on Step/Stool Needs assistance to keep from falling or unable to try    Standing Unsupported, One Foot in Front Able to plae foot ahead of the other independently and hold 30  seconds    Standing on One Leg Tries to lift leg/unable to hold 3 seconds but remains standing independently    Total Score 34    Berg comment: 34/56, high fall risk             PATIENT EDUCATION: Education details: continue HEP, obtain new referral  to PT after pain clinic meeting if warranted Person educated: Patient, Spouse Education method: Explanation and Handouts Education comprehension: verbalized understanding   HOME EXERCISE PROGRAM: Access Code: [email or web address] URL: [email or web address] Date: 09/22/2021 Prepared by: Excell Seltzer  Exercises - Sit to Stand with Armchair  - 1 x daily - 7 x weekly - 3 sets - 10 reps - Standing Balance with Eyes Open  - 1 x daily - 7 x weekly - 3 sets - 10 reps - Side Stepping with Counter Support  - 1 x daily - 7 x weekly - 3 sets - 10 reps - Backward Walking with RW Support  - 1 x daily - 7 x weekly - 3 sets - 10 reps - Alternating Step Taps with Counter/RW Support  - 1 x daily - 7 x weekly - 3 sets - 10 reps - Seated Thoracic Flexion and Rotation with Swiss Ball  - 1 x daily - 7 x weekly - 1 sets - 10 reps - Standing Single Leg Stance with Counter Support  - 1 x daily - 7 x weekly - 1 sets - 5 reps - 30 sec hold - Long Sitting Calf Stretch with Strap  - 1 x daily - 7 x weekly - 1 sets - 5 reps - 30 sec hold  -Standing trunk twists with contralateral UE reach: 5 x 10 R and L   GOALS: Goals reviewed with patient? Yes  SHORT TERM GOALS: Target date: 10/15/2021  Patient will be able to ambulate 230' with st. Cane without evidence of loss of balance to improve functional ambulation and reduce fall risk. Baseline:115' with 4 instances of LOB, 44' with RW no LOB Goal status: MET  2.  Patient will be able to use his L UE with functional transfers and also demo improved control with eccentric stand to sit to improve safe transfers. Baseline: Pt plops >90% of the with sitting, poor eccentric control with 10% of stand to  sits Goal status: MET  3.  Pt will be able to go up and down 8 stairs with use of rail/s and SBA to improve community accesss Baseline: TBD Goal status: NOT MET, goal d/c due to unsafe to attempt   LONG TERM GOALS: Target date: 11/12/2021  Pt will demo 15 sec improvement in TUG score to improve functional mobility with appropriate AD Baseline: 54 sec with st. Cane, 29.75 with RW (8/15) Goal status: MET  2.  Patient will demo 0.50 m/s or better gait speed with appropriate AD to reduce fall risk and improve community abulation Baseline: 0.32 m/s with st. Cane, 0.56 m/s with RW (1.83 ft/s) (8/15) Goal status: MET  3.  Patient will demo 5 x sit to stand with bil UE support in <40 seconds to improve functional strength and safe transfers. Baseline: 53 seconds with  R UE support, 46 sec with RW (8/15), 24 sec with no AD (9/19) Goal status: MET  4.  Patient will demo 20 points of improvement in BBS to improve balance and reduce fall risk Baseline: 11/56 (7/26), 21/56 (8/18), 34/56 (9/19) Goal status: MET  5.  Patient will demonstrate ability to navigate up/down one 6" curb with LRAD and Supervision in order to safely navigate his home environment Baseline: min A with SPC (9/19) Goal status: NOT MET    ASSESSMENT:  CLINICAL IMPRESSION: Emphasis of skilled PT session on reassessing LTG in order for d/c from PT services and adding to pt's HEP (SLS for balance and gastroc stretch for ankle  tightness, handout provided). Pt has met 4/5 LTG due to improving his TUG score from 54 sec to 29.75 sec, improving his gait speed from 0.32 m/s to 0.56 m/s, improving his 5xSTS score from 53 sec to 24 sec, and improving his BBS score from 11/56 to 34/56 from initial evaluation on 7/26 to this date. Pt did not meet Supervision level for curb navigation but was able to safely navigate this with a SPC at min A level and requires a similar level of assist at home currently. Overall, pt has shown great  improvement in his functional strength, his balance, and has decreased his fall risk. He has also shown a decrease in overall pain symptoms with physical activity. Pt is safe to d/c from PT services at this time and continue with his HEP for balance and strengthening.   OBJECTIVE IMPAIRMENTS Abnormal gait, decreased activity tolerance, decreased balance, decreased endurance, decreased mobility, difficulty walking, decreased ROM, decreased strength, decreased safety awareness, increased fascial restrictions, impaired perceived functional ability, impaired flexibility, impaired sensation, impaired tone, impaired UE functional use, improper body mechanics, postural dysfunction, and pain.   ACTIVITY LIMITATIONS carrying, lifting, bending, sitting, standing, squatting, stairs, transfers, bed mobility, bathing, toileting, dressing, hygiene/grooming, and caring for others  PARTICIPATION LIMITATIONS: meal prep, cleaning, laundry, driving, shopping, community activity, and yard work  PERSONAL FACTORS Past/current experiences, Time since onset of injury/illness/exacerbation, and 1-2 comorbidities: MS, L shoulder replacement  are also affecting patient's functional outcome.   REHAB POTENTIAL: Good  CLINICAL DECISION MAKING: Stable/uncomplicated  EVALUATION COMPLEXITY: Low       Excell Seltzer, PT, DPT, CSRS 11/16/2021, 12:47 PM

## 2021-11-18 ENCOUNTER — Encounter: Payer: Medicare HMO | Admitting: Occupational Therapy

## 2021-11-22 ENCOUNTER — Ambulatory Visit: Payer: Medicare HMO | Admitting: Occupational Therapy

## 2021-11-23 ENCOUNTER — Ambulatory Visit: Payer: Medicare HMO | Admitting: Physical Therapy

## 2021-11-23 ENCOUNTER — Encounter: Payer: Medicare HMO | Admitting: Occupational Therapy

## 2021-11-25 ENCOUNTER — Ambulatory Visit: Payer: Medicare HMO | Admitting: Physical Therapy

## 2021-11-25 ENCOUNTER — Encounter: Payer: Medicare HMO | Admitting: Occupational Therapy

## 2021-11-25 ENCOUNTER — Encounter: Payer: Medicare HMO | Admitting: Pulmonary Disease

## 2021-11-29 ENCOUNTER — Other Ambulatory Visit: Payer: Medicare HMO

## 2021-11-29 ENCOUNTER — Ambulatory Visit: Payer: Medicare HMO | Admitting: Occupational Therapy

## 2021-12-01 ENCOUNTER — Ambulatory Visit: Payer: Medicare HMO | Admitting: Physical Therapy

## 2021-12-01 ENCOUNTER — Encounter: Payer: Medicare HMO | Admitting: Occupational Therapy

## 2021-12-06 ENCOUNTER — Encounter: Payer: Self-pay | Admitting: Occupational Therapy

## 2021-12-06 ENCOUNTER — Ambulatory Visit: Payer: Medicare HMO | Attending: Neurology | Admitting: Occupational Therapy

## 2021-12-06 DIAGNOSIS — M6281 Muscle weakness (generalized): Secondary | ICD-10-CM | POA: Insufficient documentation

## 2021-12-06 DIAGNOSIS — M25622 Stiffness of left elbow, not elsewhere classified: Secondary | ICD-10-CM | POA: Diagnosis not present

## 2021-12-06 DIAGNOSIS — M25512 Pain in left shoulder: Secondary | ICD-10-CM | POA: Diagnosis not present

## 2021-12-06 DIAGNOSIS — M25612 Stiffness of left shoulder, not elsewhere classified: Secondary | ICD-10-CM | POA: Insufficient documentation

## 2021-12-06 DIAGNOSIS — R41840 Attention and concentration deficit: Secondary | ICD-10-CM | POA: Insufficient documentation

## 2021-12-06 DIAGNOSIS — G8929 Other chronic pain: Secondary | ICD-10-CM | POA: Diagnosis not present

## 2021-12-06 NOTE — Therapy (Signed)
OUTPATIENT OCCUPATIONAL THERAPY NEURO Treatment  Patient Name: Seth Lindsey MRN: [id number] DOB:[birth year], 56 y.o., male Today's Date: 12/06/2021  PCP: Dr. Reola Calkins REFERRING PROVIDER: Dr. Epimenio Foot   OT End of Session - 12/06/21 1433     Visit Number 6    Number of Visits 13    Date for OT Re-Evaluation 01/07/22    Authorization Type [employer]    Authorization - Visit Number 6    Authorization - Number of Visits 10    OT Start Time 1330    OT Stop Time 1420    OT Time Calculation (min) 50 min    Equipment Utilized During Treatment floatation equipment    Activity Tolerance Patient tolerated treatment well    Behavior During Therapy WFL for tasks assessed/performed              Past Medical History:  Diagnosis Date   Allergy    Anxiety    Depression    GERD (gastroesophageal reflux disease)    Hypertension    Multiple sclerosis ([facility])    Past Surgical History:  Procedure Laterality Date   APPENDECTOMY     arm surgery     left- biceps reattached    CERVICAL FUSION     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   Procedure: REVERSE SHOULDER ARTHROPLASTY;  Surgeon: Bjorn Pippin, MD;  Location: [facility];  Service: Orthopedics;  Laterality: Left;   Patient Active Problem List   Diagnosis Date Noted   Primary hypertension 08/26/2021   Gastroesophageal reflux disease 08/26/2021   Tobacco abuse 08/26/2021   Insomnia 02/04/2021   Depression with anxiety 02/04/2021   High risk medication use 08/03/2020   Multiple sclerosis ([facility]) 03/19/2020   Gait disturbance 03/19/2020   Spasticity 03/19/2020   Injury of left upper arm 03/19/2020    ONSET DATE: 09/20/21  REFERRING DIAG: M25.512 (ICD-10-CM) - Left shoulder pain, unspecified G35 (ICD-10-CM) - Multiple sclerosis ([facility])  THERAPY DIAG:  Muscle weakness (generalized)  Stiffness of left elbow, not elsewhere classified  Chronic left shoulder pain  Attention and concentration deficit  Rationale  for Evaluation and Treatment Rehabilitation  SUBJECTIVE:   SUBJECTIVE STATEMENT: Pt reports ongoing  Left shoulder and arm pain Pt accompanied by: significant other fiancee  PERTINENT HISTORY: MS, Pt had accident where he tore his L biceps tendon and they had to reattach it and also had broken bone fragments., he waited 6 mons for surgery due to Covid. He had L shoulder replacement last September and he had PT afterwards. PMH: HTN, hx of cervical fusion, HTN, ADHD, dyslexia  PRECAUTIONS: Fall, hx of L shoulder replacement Sept. 2022, hx of biceps tear LUE, cervical fusion  WEIGHT BEARING RESTRICTIONS No  PAIN:  Are you having pain? Yes: NPRS scale: 9/10 Pain location: left shoulder and generalized Pain description: aching Aggravating factors: reaching Relieving factors: rest  FALLS: Has patient fallen in last 6 months? Yes. Number of falls multiple  LIVING ENVIRONMENT: Lives with: lives with their spouse(fiancee) Lives in: House/apartment  Has following equipment at home: Single point cane and up walker  PLOF: Independent with basic ADLs and Independent with gait  PATIENT GOALS improved LUE function  OBJECTIVE:    OBSERVATIONS: Pt present for first aquatic therapy visit   TODAY'S TREATMENT:  12/06/21 Patient seen for aquatic therapy session.  Patient entered and exited the pool via stairs and bilateral railings and cueing for step through pattern, progressing left hand on railing.  Patient reporting pain 8/10 at start  of session.   Session took place in 3.5-4.5 ft of warm water.  Started session with water walking and allowing arms to gently swing at sides.  Patient reports pain is improving outside of water environment, and is using theracane to work out shoulder pain.   Worked on prone float - float with abduction, and light weight training with resistance in all directions.   Worked on Land" in water with water ball  - encouraging combinations of  movements in shoulder, elbow, forearm, wrist and hand.  Patient able to pass ball behind his back, under his thigh, etc without increased pain.   Supine floatation to address shoulder mechanics and passive range as well as body on arm motion in ranges.  Working with hed of humerus to drop in glenoid fossa., and scapula to descend on back.  These motions set off mild spasms, which patient was able to control with relaxation/ breathing techniques.   Patient pleased with improvement.  Reports pain 7/10 at end of session.    HOME EXERCISE PROGRAM: N/A    GOALS: Goals reviewed with patient? No  SHORT TERM GOALS: Target date: 11/14/21  I with initial HEP. Baseline: Goal status: IN PROGRESS  2.  I with pain reduction strategies and positioning for LUE. Baseline:  Goal status: IN PROGRESS  3.  I with energy conservation techniques. Baseline:  Goal status: IN PROGRESS  4.  I with adapted equipment/ adapted strategies to increase I with ADLS/IADLS(ie: tub transfer bench, reacher for LB dressing) Baseline:  Goal status: IN PROGRESS  5.  Pt will demonstrate ability to retrieve a lightweight object at 85* with pain no greater than 6/10. Baseline: 75*, pain 9/10 Goal status: IN PROGRESS  6.  Test box and blocks and set goal Baseline:  Goal status: REVISED - NA  LONG TERM GOALS: Target date: 01/07/22  I with updated HEP. Baseline:  Goal status: INITIAL  2.  I with memory compensation strategies. Baseline:  Goal status: INITIAL  3.  Pt will demonstrate improved fine motor coordination  for ADLs as evidenced by decreasing 9 hole peg test score to 52 secs or less. Baseline: 62 secs Goal status: INITIAL  4.  Pt will increase LUE grip strength to 30 lbs or greater for increased functional use. Baseline: 24.6 Goal status: INITIAL  5.  Pt will perform LB dressing modified independently. Baseline: dependent for shoes and socks Goal status: INITIAL  6. Pt will report using LUE as a  non dominant assist for ADLS at least 53% of the time. Baseline: not using consistently Goal status: INITIAL  ASSESSMENT:  CLINICAL IMPRESSION: Patient missed two weeks of OT due to illness.  Patient reports pain was improving prior to flu - but has rebounded back to 8 since he has been sick. Pt is showing some small improvement in report of pain in LUE.  He is showing consistent improvement in active range of motion and muscle balance in LUE - especially distally.    PERFORMANCE DEFICITS in functional skills including ADLs, IADLs, coordination, dexterity, sensation, ROM, strength, pain, flexibility, FMC, GMC, balance, decreased knowledge of precautions, decreased knowledge of use of DME, and UE functional use, cognitive skills including attention, learn, memory, problem solving, safety awareness, sequencing, thought, and understand, and psychosocial skills including coping strategies, environmental adaptation, habits, interpersonal interactions, and routines and behaviors.   IMPAIRMENTS are limiting patient from ADLs, IADLs, rest and sleep, play, leisure, and social participation.   COMORBIDITIES may have co-morbidities  that  affects occupational performance. Patient will benefit from skilled OT to address above impairments and improve overall function.  MODIFICATION OR ASSISTANCE TO COMPLETE EVALUATION: Min-Moderate modification of tasks or assist with assess necessary to complete an evaluation.  OT OCCUPATIONAL PROFILE AND HISTORY: Detailed assessment: Review of records and additional review of physical, cognitive, psychosocial history related to current functional performance.  CLINICAL DECISION MAKING: LOW - limited treatment options, no task modification necessary  REHAB POTENTIAL: Good  EVALUATION COMPLEXITY: Low    PLAN: OT FREQUENCY: 1x/week  OT DURATION: 12 weeks plus eval  PLANNED INTERVENTIONS: self care/ADL training, therapeutic exercise, therapeutic activity,  neuromuscular re-education, manual therapy, passive range of motion, balance training, functional mobility training, electrical stimulation, ultrasound, paraffin, fluidotherapy, moist heat, cryotherapy, patient/family education, cognitive remediation/compensation, visual/perceptual remediation/compensation, energy conservation, coping strategies training, DME and/or AE instructions, and Re-evaluation  RECOMMENDED OTHER SERVICES: PT  CONSULTED AND AGREED WITH PLAN OF CARE: Patient and family member/caregiver  PLAN FOR NEXT SESSION:  pain reduction strategies, aquatic therapy   Collier Salina, OT 12/06/2021, 2:35 PM

## 2021-12-07 DIAGNOSIS — M19012 Primary osteoarthritis, left shoulder: Secondary | ICD-10-CM | POA: Diagnosis not present

## 2021-12-08 ENCOUNTER — Encounter: Payer: Medicare HMO | Admitting: Occupational Therapy

## 2021-12-08 ENCOUNTER — Ambulatory Visit: Payer: Medicare HMO | Admitting: Physical Therapy

## 2021-12-13 ENCOUNTER — Ambulatory Visit: Payer: Medicare HMO | Admitting: Occupational Therapy

## 2021-12-14 ENCOUNTER — Other Ambulatory Visit: Payer: Self-pay | Admitting: Neurology

## 2021-12-20 ENCOUNTER — Other Ambulatory Visit: Payer: Self-pay | Admitting: Neurology

## 2021-12-20 ENCOUNTER — Encounter: Payer: Self-pay | Admitting: Occupational Therapy

## 2021-12-20 ENCOUNTER — Ambulatory Visit: Payer: Medicare HMO | Admitting: Occupational Therapy

## 2021-12-20 DIAGNOSIS — M25512 Pain in left shoulder: Secondary | ICD-10-CM | POA: Diagnosis not present

## 2021-12-20 DIAGNOSIS — M6281 Muscle weakness (generalized): Secondary | ICD-10-CM | POA: Diagnosis not present

## 2021-12-20 DIAGNOSIS — M25622 Stiffness of left elbow, not elsewhere classified: Secondary | ICD-10-CM

## 2021-12-20 DIAGNOSIS — R41840 Attention and concentration deficit: Secondary | ICD-10-CM | POA: Diagnosis not present

## 2021-12-20 DIAGNOSIS — M25612 Stiffness of left shoulder, not elsewhere classified: Secondary | ICD-10-CM

## 2021-12-20 DIAGNOSIS — G8929 Other chronic pain: Secondary | ICD-10-CM | POA: Diagnosis not present

## 2021-12-20 NOTE — Therapy (Signed)
OUTPATIENT OCCUPATIONAL THERAPY NEURO Treatment  Patient Name: Seth Lindsey MRN: [id number] DOB:[birth year], 56 y.o., male Today's Date: 12/20/2021  PCP: Dr. Olevia Bowens REFERRING PROVIDER: Dr. Felecia Shelling   OT End of Session - 12/20/21 1503     Visit Number 7    Number of Visits 13    Date for OT Re-Evaluation 01/07/22    Authorization Type [employer]    Authorization - Visit Number 7    Authorization - Number of Visits 10    Progress Note Due on Visit 10    OT Start Time 1332    OT Stop Time 1415    OT Time Calculation (min) 43 min    Equipment Utilized During Treatment floatation equipment    Activity Tolerance Patient tolerated treatment well    Behavior During Therapy WFL for tasks assessed/performed              Past Medical History:  Diagnosis Date   Allergy    Anxiety    Depression    GERD (gastroesophageal reflux disease)    Hypertension    Multiple sclerosis ([place])    Past Surgical History:  Procedure Laterality Date   APPENDECTOMY     arm surgery     left- biceps reattached    CERVICAL FUSION     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   Procedure: REVERSE SHOULDER ARTHROPLASTY;  Surgeon: Hiram Gash, MD;  Location: [facility];  Service: Orthopedics;  Laterality: Left;   Patient Active Problem List   Diagnosis Date Noted   Primary hypertension 08/26/2021   Gastroesophageal reflux disease 08/26/2021   Tobacco abuse 08/26/2021   Insomnia 02/04/2021   Depression with anxiety 02/04/2021   High risk medication use 08/03/2020   Multiple sclerosis ([place]) 03/19/2020   Gait disturbance 03/19/2020   Spasticity 03/19/2020   Injury of left upper arm 03/19/2020    ONSET DATE: 09/20/21  REFERRING DIAG: M25.512 (ICD-10-CM) - Left shoulder pain, unspecified G35 (ICD-10-CM) - Multiple sclerosis ([place])  THERAPY DIAG:  Muscle weakness (generalized)  Stiffness of left elbow, not elsewhere classified  Chronic left shoulder pain  Stiffness of  left shoulder, not elsewhere classified  Rationale for Evaluation and Treatment Rehabilitation  SUBJECTIVE:   SUBJECTIVE STATEMENT: Pt reports ongoing  Left shoulder and arm pain Pt accompanied by: significant other fiancee  PERTINENT HISTORY: MS, Pt had accident where he tore his L biceps tendon and they had to reattach it and also had broken bone fragments., he waited 6 mons for surgery due to Covid. He had L shoulder replacement last September and he had PT afterwards. PMH: HTN, hx of cervical fusion, HTN, ADHD, dyslexia  PRECAUTIONS: Fall, hx of L shoulder replacement Sept. 2022, hx of biceps tear LUE, cervical fusion  WEIGHT BEARING RESTRICTIONS No  PAIN:  Are you having pain? Yes: NPRS scale: 9/10 Pain location: left shoulder and generalized Pain description: aching Aggravating factors: reaching Relieving factors: rest  FALLS: Has patient fallen in last 6 months? Yes. Number of falls multiple  LIVING ENVIRONMENT: Lives with: lives with their spouse(fiancee) Lives in: House/apartment  Has following equipment at home: Single point cane and up walker  PLOF: Independent with basic ADLs and Independent with gait  PATIENT GOALS improved LUE function  OBJECTIVE:    OBSERVATIONS: Pt present for first aquatic therapy visit   TODAY'S TREATMENT:  12/20/21 Patient seen for aquatic therapy session.  Patient entered and exited the pool via stairs and bilateral railings with step through pattern, progressing left  hand on railing.  Patient reporting pain 8/10 at start of session.   Session took place in 3.5-4.5 ft of warm water.  Started session with water walking and allowing arms to gently swing at sides.  Patient reports pain is improving outside of water environment, and is using theracane to work out shoulder pain.  Worked on arms floating to surface versus over recruiting - in chest to shoulder depth to increase shoulder range.    Supine floatation to address shoulder  mechanics and passive range as well as body on arm motion in ranges.  Modified plantigrade to address shoulder stretching - flexion, and extension with emphasis on trunk extension scapular descent.   Manual work to decrease pectoralis / bicep activation with every arm movement.  Working with head of humerus to drop in glenoid fossa., and scapula to descend on back.  These motions set off mild spasms, which patient was able to control with relaxation/ breathing techniques.   Patient pleased with improvement.  Reports pain 7/10 at end of session.    HOME EXERCISE PROGRAM: N/A    GOALS: Goals reviewed with patient? No  SHORT TERM GOALS: Target date: 11/14/21  I with initial HEP. Baseline: Goal status: MET  2.  I with pain reduction strategies and positioning for LUE. Baseline:  Goal status: MET  3.  I with energy conservation techniques. Baseline:  Goal status: MET  4.  I with adapted equipment/ adapted strategies to increase I with ADLS/IADLS(ie: tub transfer bench, reacher for LB dressing) Baseline:  Goal status: IN PROGRESS  5.  Pt will demonstrate ability to retrieve a lightweight object at 85* with pain no greater than 6/10. Baseline: 75*, pain 9/10 Goal status: IN PROGRESS  6.  Test box and blocks and set goal Baseline:  Goal status: REVISED - NA  LONG TERM GOALS: Target date: 02/27/22  I with updated HEP. Baseline:  Goal status: ONGOING  2.  I with memory compensation strategies. Baseline:  Goal status: MET  3.  Pt will demonstrate improved fine motor coordination  for ADLs as evidenced by decreasing 9 hole peg test score to 52 secs or less. Baseline: 62 secs Goal status: NA- D/C GOAL  4.  Pt will increase LUE grip strength to 30 lbs or greater for increased functional use. Baseline: 24.6 Goal status: ONGOING  5.  Pt will perform LB dressing modified independently. Baseline: dependent for shoes and socks Goal status: ONGOING  6. Pt will report using LUE  as a non dominant assist for ADLS at least 35% of the time. Baseline: not using consistently Goal status: ONGOING  ASSESSMENT:  CLINICAL IMPRESSION: Patient missed last week  OT due to transportation issue.  Patient reports pain has been better, but he "got up funny" and pain is back to an 8 - had been a 7.  He is showing consistent improvement in active range of motion and muscle balance in LUE - especially distally.    PERFORMANCE DEFICITS in functional skills including ADLs, IADLs, coordination, dexterity, sensation, ROM, strength, pain, flexibility, FMC, GMC, balance, decreased knowledge of precautions, decreased knowledge of use of DME, and UE functional use, cognitive skills including attention, learn, memory, problem solving, safety awareness, sequencing, thought, and understand, and psychosocial skills including coping strategies, environmental adaptation, habits, interpersonal interactions, and routines and behaviors.   IMPAIRMENTS are limiting patient from ADLs, IADLs, rest and sleep, play, leisure, and social participation.   COMORBIDITIES may have co-morbidities  that affects occupational performance. Patient will benefit  from skilled OT to address above impairments and improve overall function.  MODIFICATION OR ASSISTANCE TO COMPLETE EVALUATION: Min-Moderate modification of tasks or assist with assess necessary to complete an evaluation.  OT OCCUPATIONAL PROFILE AND HISTORY: Detailed assessment: Review of records and additional review of physical, cognitive, psychosocial history related to current functional performance.  CLINICAL DECISION MAKING: LOW - limited treatment options, no task modification necessary  REHAB POTENTIAL: Good  EVALUATION COMPLEXITY: Low    PLAN: OT FREQUENCY: 1x/week  OT DURATION: 12 weeks plus eval  PLANNED INTERVENTIONS: self care/ADL training, therapeutic exercise, therapeutic activity, neuromuscular re-education, manual therapy, passive range  of motion, balance training, functional mobility training, electrical stimulation, ultrasound, paraffin, fluidotherapy, moist heat, cryotherapy, patient/family education, cognitive remediation/compensation, visual/perceptual remediation/compensation, energy conservation, coping strategies training, DME and/or AE instructions, and Re-evaluation  RECOMMENDED OTHER SERVICES: PT  CONSULTED AND AGREED WITH PLAN OF CARE: Patient and family member/caregiver  PLAN FOR NEXT SESSION:  pain reduction strategies, aquatic therapy   Mariah Milling, OT 12/20/2021, 3:04 PM

## 2021-12-21 MED ORDER — RISPERIDONE 0.25 MG PO TABS: 0.5000 mg | ORAL_TABLET | Freq: Every day | ORAL | 1 refills | Status: DC

## 2021-12-23 ENCOUNTER — Ambulatory Visit: Payer: Medicare HMO | Admitting: Physical Medicine & Rehabilitation

## 2021-12-29 ENCOUNTER — Telehealth: Payer: Self-pay | Admitting: Neurology

## 2021-12-29 ENCOUNTER — Other Ambulatory Visit: Payer: Self-pay | Admitting: Neurology

## 2021-12-29 MED ORDER — RISPERIDONE 0.25 MG PO TABS: 0.5000 mg | ORAL_TABLET | Freq: Every day | ORAL | 0 refills | Status: DC

## 2021-12-29 MED ORDER — DOXEPIN HCL 10 MG PO CAPS: 10.0000 mg | ORAL_CAPSULE | Freq: Every day | ORAL | 0 refills | Status: DC

## 2021-12-29 NOTE — Telephone Encounter (Signed)
Pt states he was informed Dr Felecia Shelling declined his refill of isperiDONE (RISPERDAL) 0.25 MG tablet & doxepin (SINEQUAN) 10 MG capsule to  [facility] .  Pt states he very much needs these medications and would like a call to discuss if there is a reason as to why these meds can not be filled.

## 2021-12-29 NOTE — Telephone Encounter (Signed)
Called the pt back and advised that the risperidone was sent 12/21/2021 and confirmed by pharmacy. The patient states that pharmacy doesn't have it. I have resent both doxepin and risperidone to [facility]

## 2022-01-04 ENCOUNTER — Other Ambulatory Visit: Payer: Self-pay

## 2022-01-04 MED ORDER — KESIMPTA 20 MG/0.4ML ~~LOC~~ SOAJ: 20.0000 mg | SUBCUTANEOUS | 0 refills | Status: DC

## 2022-01-06 ENCOUNTER — Telehealth: Payer: Self-pay | Admitting: Neurology

## 2022-01-06 DIAGNOSIS — G35 Multiple sclerosis: Secondary | ICD-10-CM

## 2022-01-06 MED ORDER — KESIMPTA 20 MG/0.4ML ~~LOC~~ SOAJ: 20.0000 mg | SUBCUTANEOUS | 2 refills | Status: DC

## 2022-01-06 NOTE — Telephone Encounter (Signed)
Huntley Dec from [facility] called needing to discuss the directions written for the pt's Ofatumumab (KESIMPTA) 20 MG/0.4ML SOAJ

## 2022-01-06 NOTE — Telephone Encounter (Signed)
Called back and spoke w/ Asher Muir. Transferred to pharmacist. Provided VO for Kesimpta 20mg  qty 1.29ml/90days (ok to dispense 30 days if needed), 2 refills to take monthly. They will process rx for pt. Nothing further needed.

## 2022-01-11 IMAGING — CT CT SHOULDER*L* W/O CM
1 series · 12 of 14 positions shown, 15 images · non-contrast
Comparison: MRI shoulder 09/04/2020

CLINICAL DATA: Left shoulder pain, preop

EXAM:
CT OF THE UPPER LEFT EXTREMITY WITHOUT CONTRAST
TECHNIQUE: Multidetector CT imaging of the upper left extremity was performed
according to the standard protocol.

[Series 4: soft tissue · axial · 0.54mm/px · z∈[-278,-80]mm · 12 of 119 slices shown, 15 images]
[im 10/119  soft-tissue]
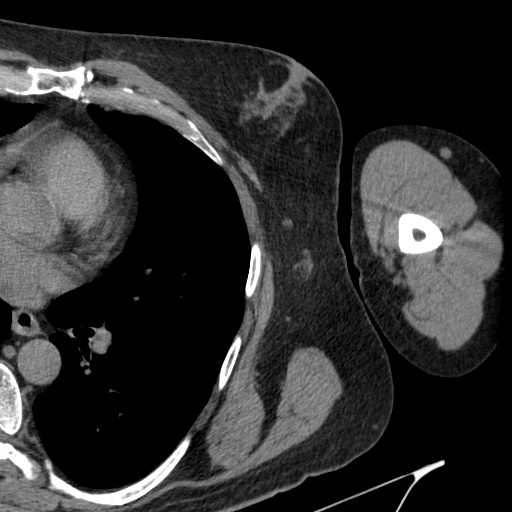
[im 10/119  bone]
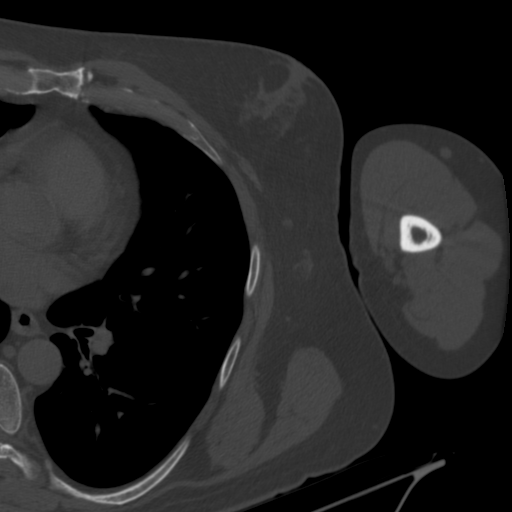
[im 19/119  bone]
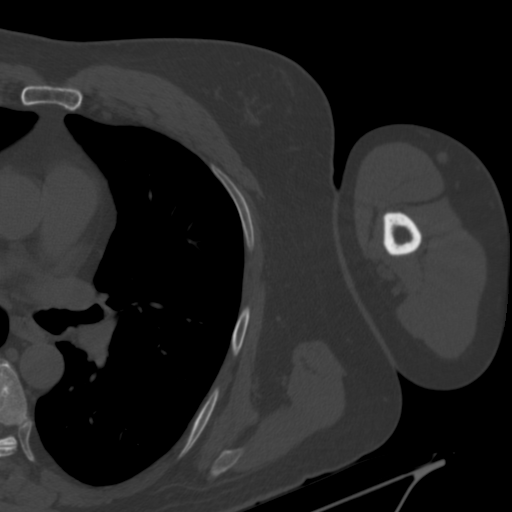
[im 28/119  bone]
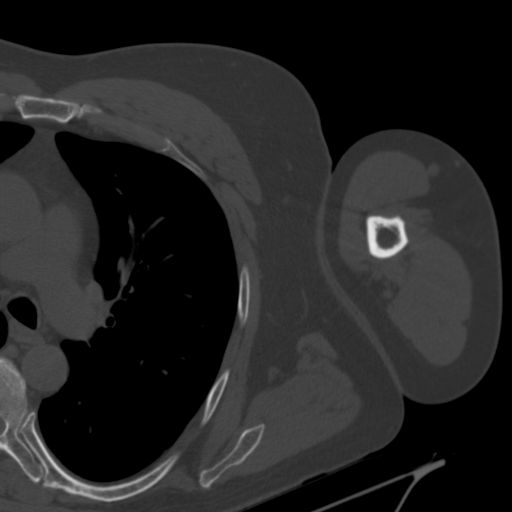
[im 37/119  bone]
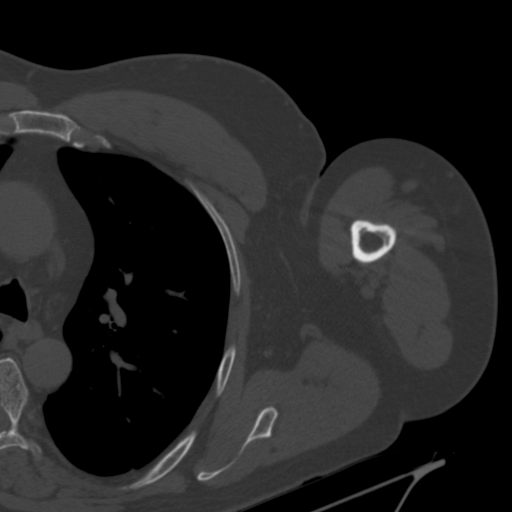
[im 46/119  soft-tissue]
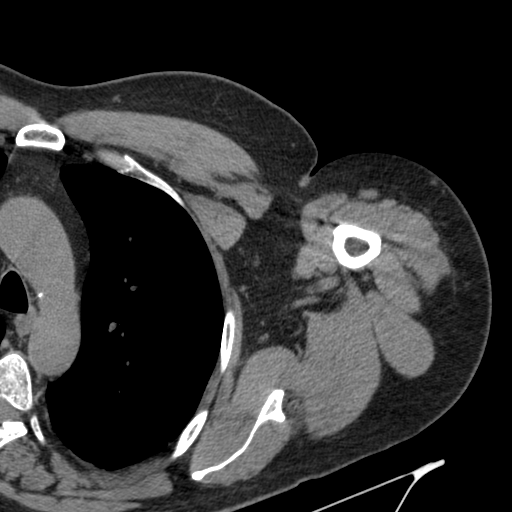
[im 46/119  bone]
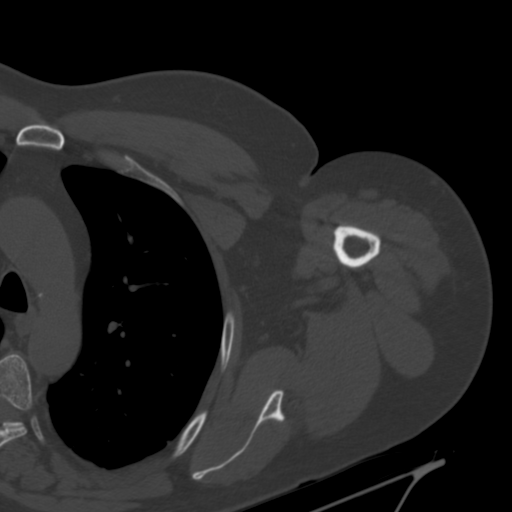
[im 55/119  bone]
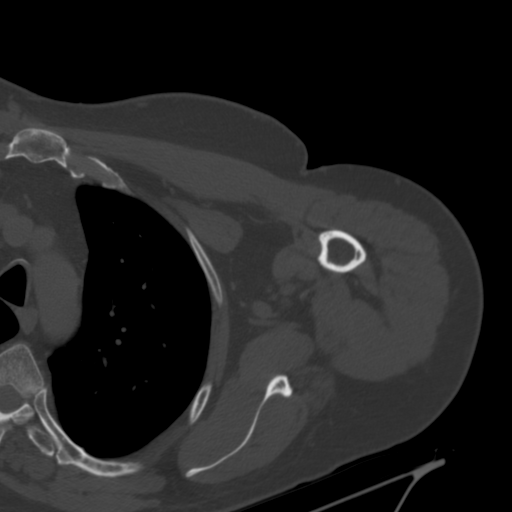
[im 64/119  bone]
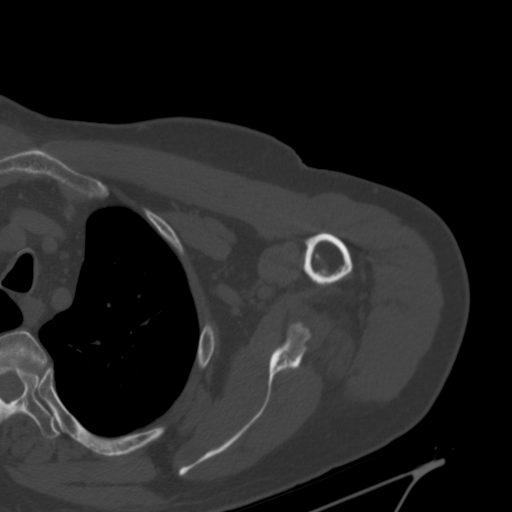
[im 73/119  bone]
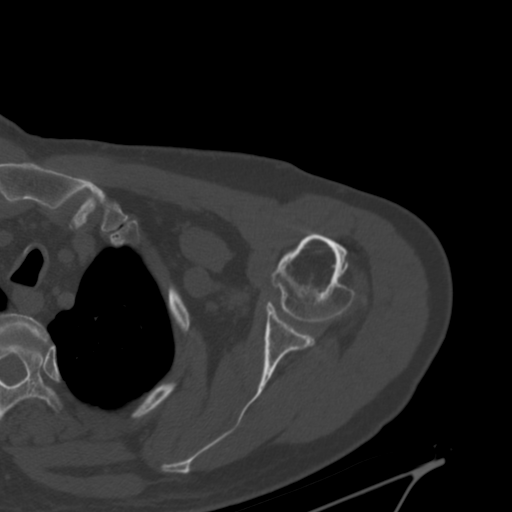
[im 82/119  soft-tissue]
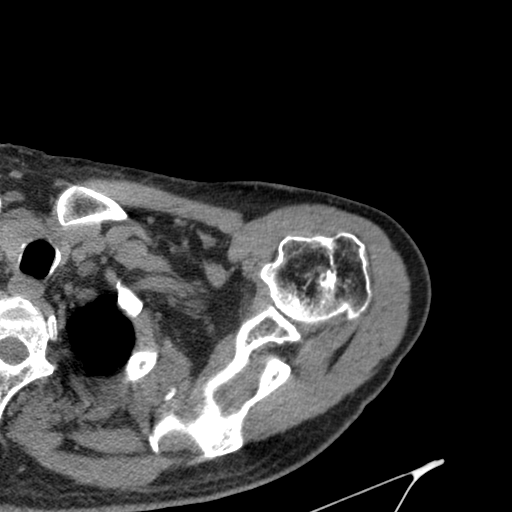
[im 82/119  bone]
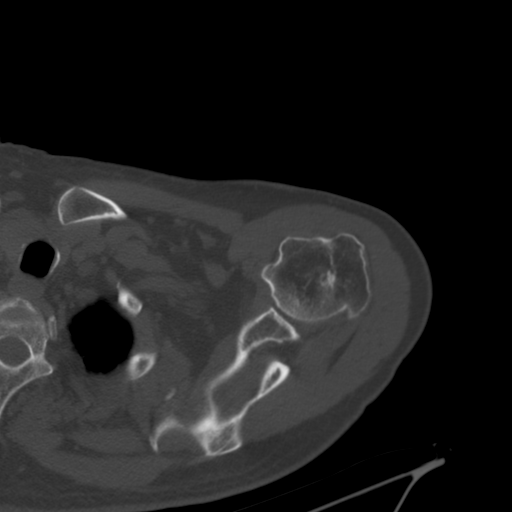
[im 91/119  bone]
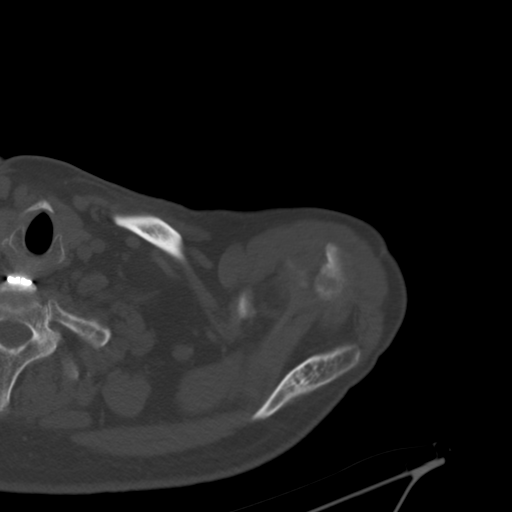
[im 100/119  bone]
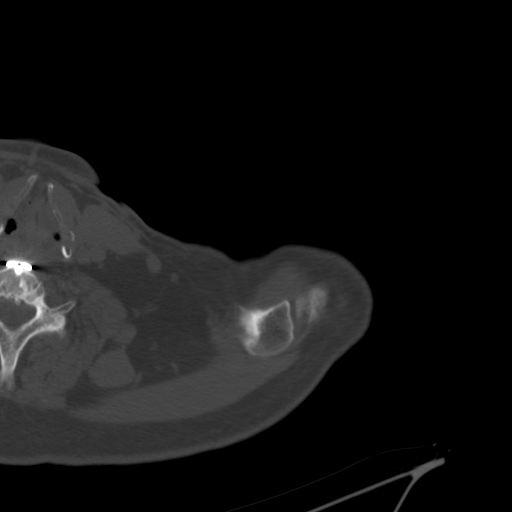
[im 109/119  bone]
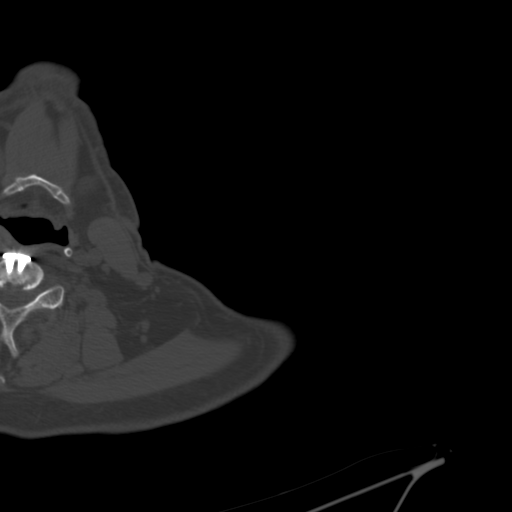

[12 of 14 positions shown; findings below may reference images not displayed]

FINDINGS: Bones/Joint/Cartilage

Moderate glenohumeral osteoarthritis with bony remodeling. Chronic
fracture deformity of the proximal humerus which appears healed.
Adequate glenoid bone stock. Neutral glenoid version.

Mild AC joint arthritis. Partially visualized ACDF in the cervical
spine.

Ligaments

Suboptimally assessed by CT.

Muscles and Tendons

There is isolated mild teres minor atrophy. No supraspinatus,
infraspinatus, or subscapularis muscle atrophy. Prior biceps
tenodesis

Soft tissues

Unremarkable.
IMPRESSION: Moderate glenohumeral osteoarthritis with bony remodeling. Chronic
fracture deformity of the proximal humerus. Adequate glenoid bone
stock with neutral glenoid version.

Isolated mild teres minor atrophy. No supraspinatus, infraspinatus,
or subscapularis atrophy.

## 2022-01-17 ENCOUNTER — Ambulatory Visit: Payer: Self-pay | Admitting: Occupational Therapy

## 2022-01-25 NOTE — Telephone Encounter (Signed)
Error

## 2022-01-31 DIAGNOSIS — F411 Generalized anxiety disorder: Secondary | ICD-10-CM | POA: Diagnosis not present

## 2022-01-31 DIAGNOSIS — F4312 Post-traumatic stress disorder, chronic: Secondary | ICD-10-CM | POA: Diagnosis not present

## 2022-01-31 DIAGNOSIS — F331 Major depressive disorder, recurrent, moderate: Secondary | ICD-10-CM | POA: Diagnosis not present

## 2022-02-03 ENCOUNTER — Encounter: Payer: Medicare HMO | Admitting: Physical Medicine & Rehabilitation

## 2022-02-04 ENCOUNTER — Other Ambulatory Visit: Payer: Self-pay | Admitting: Family Medicine

## 2022-02-04 DIAGNOSIS — I10 Essential (primary) hypertension: Secondary | ICD-10-CM

## 2022-02-16 ENCOUNTER — Telehealth: Payer: Self-pay | Admitting: *Deleted

## 2022-02-16 ENCOUNTER — Ambulatory Visit: Payer: Medicare HMO | Admitting: Neurology

## 2022-02-16 NOTE — Telephone Encounter (Signed)
Submitted PA Kesimpta on covermymeds. Key; [id number]. Waiting on determination from [facility].

## 2022-02-16 NOTE — Telephone Encounter (Signed)
Received fax from [facility] that PA approved for 30 days supply at a time d/t being specialty drug until 02/28/23. Request denied for 90 days. Specialty drugs are limited to 30 days a time they said.

## 2022-02-23 ENCOUNTER — Encounter: Payer: Medicare HMO | Admitting: Family Medicine

## 2022-03-02 ENCOUNTER — Ambulatory Visit (INDEPENDENT_AMBULATORY_CARE_PROVIDER_SITE_OTHER): Payer: Medicare HMO | Admitting: Family Medicine

## 2022-03-02 ENCOUNTER — Encounter: Payer: Self-pay | Admitting: Family Medicine

## 2022-03-02 VITALS — BP 138/83 | HR 72 | Temp 98.0°F | Resp 16 | Wt 233.0 lb

## 2022-03-02 DIAGNOSIS — Z720 Tobacco use: Secondary | ICD-10-CM

## 2022-03-02 DIAGNOSIS — Z0000 Encounter for general adult medical examination without abnormal findings: Secondary | ICD-10-CM

## 2022-03-02 DIAGNOSIS — G35 Multiple sclerosis: Secondary | ICD-10-CM

## 2022-03-02 DIAGNOSIS — K219 Gastro-esophageal reflux disease without esophagitis: Secondary | ICD-10-CM

## 2022-03-02 DIAGNOSIS — I10 Essential (primary) hypertension: Secondary | ICD-10-CM

## 2022-03-02 MED ORDER — AMLODIPINE-OLMESARTAN 5-20 MG PO TABS: 1.0000 | ORAL_TABLET | Freq: Every day | ORAL | 1 refills | Status: DC

## 2022-03-02 MED ORDER — OMEPRAZOLE 20 MG PO CPDR: 20.0000 mg | DELAYED_RELEASE_CAPSULE | Freq: Every day | ORAL | 1 refills | Status: DC

## 2022-03-02 NOTE — Progress Notes (Signed)
Complete physical exam  Patient: Seth Lindsey   DOB: [birth year]   57 y.o. Male  MRN: [id number]  Subjective:    Chief Complaint  Patient presents with   Annual Exam    Seth Lindsey is a 57 y.o. male who presents today for a complete physical exam. He reports consuming a general diet.  He participates in regular PT when MS pain is under control.  He generally feels fairly well. He reports sleeping poorly due to MS flare - following with neuro. He does not have additional problems to discuss today.   He has significantly cut back on smoking. He was doing 3.5 packs/day and is now down to 1 pack/day.    Most recent fall risk assessment:     No data to display           Most recent depression screenings:    03/02/2022    2:05 PM  PHQ 2/9 Scores  PHQ - 2 Score 2  PHQ- 9 Score 8    Vision:Within last year and Dental: No current dental problems and No regular dental care   Patient Active Problem List   Diagnosis Date Noted   Primary hypertension 08/26/2021   Gastroesophageal reflux disease 08/26/2021   Tobacco abuse 08/26/2021   Insomnia 02/04/2021   Depression with anxiety 02/04/2021   High risk medication use 08/03/2020   Multiple sclerosis ([place]) 03/19/2020   Gait disturbance 03/19/2020   Spasticity 03/19/2020   Injury of left upper arm 03/19/2020   Allergies  Allergen Reactions   Baclofen     Turned pale, BP dropped   Other     Influenza vaccine- paralyzed muscles      Patient Care Team: Terrilyn Saver, NP as PCP - General (Family Medicine)   Outpatient Medications Prior to Visit  Medication Sig   doxepin (SINEQUAN) 10 MG capsule Take 1 capsule (10 mg total) by mouth at bedtime.   gabapentin (NEURONTIN) 300 MG capsule Take 3 capsules (900 mg total) by mouth 3 (three) times daily.   KESIMPTA 20 MG/0.4ML SOAJ Inject 20 mg into the skin every 30 (thirty) days.   lamoTRIgine (LAMICTAL) 100 MG tablet Take as directed and then take 1 po bid   PRAZOSIN HCL PO Take  by mouth.   risperiDONE (RISPERDAL) 0.25 MG tablet Take 2 tablets (0.5 mg total) by mouth at bedtime.   rOPINIRole (REQUIP) 0.5 MG tablet TAKE 1 TABLET THREE TIMES DAILY   sertraline (ZOLOFT) 100 MG tablet Take 100 mg by mouth daily.   tiZANidine (ZANAFLEX) 4 MG tablet Take 1 tablet (4 mg total) by mouth in the morning, at noon, in the evening, and at bedtime.   [DISCONTINUED] amLODipine-olmesartan (AZOR) 5-20 MG tablet TAKE 1 TABLET EVERY DAY   [DISCONTINUED] omeprazole (PRILOSEC) 20 MG capsule Take 1 capsule (20 mg total) by mouth daily.   No facility-administered medications prior to visit.    ROS        Objective:     BP 138/83   Pulse 72   Temp 98 F (36.7 C)   Resp 16   Wt 233 lb (105.7 kg)   SpO2 98%   BMI 34.41 kg/m    Physical Exam Vitals reviewed.  Constitutional:      General: He is not in acute distress.    Appearance: Normal appearance. He is not ill-appearing.  HENT:     Head: Normocephalic and atraumatic.     Right Ear: Tympanic membrane normal.  Left Ear: Tympanic membrane normal.     Nose: Nose normal.     Mouth/Throat:     Mouth: Mucous membranes are moist.     Pharynx: Oropharynx is clear.  Eyes:     Extraocular Movements: Extraocular movements intact.     Conjunctiva/sclera: Conjunctivae normal.     Pupils: Pupils are equal, round, and reactive to light.  Neck:     Vascular: No carotid bruit.  Cardiovascular:     Rate and Rhythm: Normal rate and regular rhythm.     Pulses: Normal pulses.     Heart sounds: Normal heart sounds.  Pulmonary:     Effort: Pulmonary effort is normal.     Breath sounds: Normal breath sounds.  Abdominal:     General: Abdomen is flat. Bowel sounds are normal. There is no distension.     Palpations: Abdomen is soft. There is no mass.     Tenderness: There is no abdominal tenderness. There is no right CVA tenderness, left CVA tenderness, guarding or rebound.  Genitourinary:    Comments: Deferred  exam Musculoskeletal:        General: Normal range of motion.     Cervical back: Normal range of motion and neck supple. No tenderness.     Right lower leg: No edema.     Left lower leg: No edema.  Lymphadenopathy:     Cervical: No cervical adenopathy.  Skin:    General: Skin is warm and dry.     Capillary Refill: Capillary refill takes less than 2 seconds.  Neurological:     Mental Status: He is alert and oriented to person, place, and time. Mental status is at baseline.     Sensory: Sensory deficit present.     Motor: Weakness present.     Gait: Gait abnormal.  Psychiatric:        Mood and Affect: Mood normal.        Behavior: Behavior normal.        Thought Content: Thought content normal.        Judgment: Judgment normal.      No results found for any visits on 03/02/22.     Assessment & Plan:    Routine Health Maintenance and Physical Exam   There is no immunization history on file for this patient.  Health Maintenance  Topic Date Due   COVID-19 Vaccine (1) Never done   DTaP/Tdap/Td (1 - Tdap) Never done   Zoster Vaccines- Shingrix (1 of 2) Never done   COLONOSCOPY (Pts 45-54yrs Insurance coverage will need to be confirmed)  Never done   Medicare Annual Wellness (AWV)  08/18/2021   INFLUENZA VACCINE  Never done   Hepatitis C Screening  Completed   HIV Screening  Completed   HPV VACCINES  Aged Out  - reports colonoscopy last year, can't remember where it was done. They will check and try to have records sent to [place].  Discussed health benefits of physical activity, and encouraged him to engage in regular exercise appropriate for his age and condition.  Problem List Items Addressed This Visit       Cardiovascular and Mediastinum   Primary hypertension    Blood pressure is at goal for age and co-morbidities.  I recommend continue amlodipine-olmesartan.   - BP goal <130/80 - monitor and log blood pressures at home - check around the same time each day in a  relaxed setting - Limit salt to <2000 mg/day - Follow DASH eating plan (heart  healthy diet) - limit alcohol to 2 standard drinks per day for men and 1 per day for women - avoid tobacco products - get at least 2 hours of regular aerobic exercise weekly Patient aware of signs/symptoms requiring further/urgent evaluation. Labs updated today.       Relevant Medications   amLODipine-olmesartan (AZOR) 5-20 MG tablet     Digestive   Gastroesophageal reflux disease    Controlled with Prilosec and lifestyle measures.       Relevant Medications   omeprazole (PRILOSEC) 20 MG capsule     Nervous and Auditory   Multiple sclerosis ([facility])    Continue following with neurology.         Other   Tobacco abuse    Working on reducing smoking. Down to 1 pack/day. Aware of risks. States he will schedule his lung cancer screening once MS flare settles down      Other Visit Diagnoses     Annual physical exam    -  Primary   Relevant Orders   CBC with Differential/Platelet   Comprehensive metabolic panel   Lipid panel   TSH      Return in about 6 months (around 08/31/2022) for routine follow-up.     Clayborne Dana, NP

## 2022-03-02 NOTE — Assessment & Plan Note (Signed)
Working on reducing smoking. Down to 1 pack/day. Aware of risks. States he will schedule his lung cancer screening once MS flare settles down

## 2022-03-02 NOTE — Assessment & Plan Note (Signed)
Blood pressure is at goal for age and co-morbidities.  I recommend continue amlodipine-olmesartan.   - BP goal <130/80 - monitor and log blood pressures at home - check around the same time each day in a relaxed setting - Limit salt to <2000 mg/day - Follow DASH eating plan (heart healthy diet) - limit alcohol to 2 standard drinks per day for men and 1 per day for women - avoid tobacco products - get at least 2 hours of regular aerobic exercise weekly Patient aware of signs/symptoms requiring further/urgent evaluation. Labs updated today.

## 2022-03-02 NOTE — Assessment & Plan Note (Signed)
Continue following with neurology.

## 2022-03-02 NOTE — Assessment & Plan Note (Signed)
Controlled with Prilosec and lifestyle measures.

## 2022-03-03 LAB — LIPID PANEL
Cholesterol: 143 mg/dL (ref 0–200)
HDL: 38.4 mg/dL — ABNORMAL LOW (ref >39.00)
LDL Cholesterol: 82 mg/dL (ref 0–99)
NonHDL: 105.07
Total CHOL/HDL Ratio: 4
Triglycerides: 113 mg/dL (ref 0.0–149.0)
VLDL: 22.6 mg/dL (ref 0.0–40.0)

## 2022-03-03 LAB — COMPREHENSIVE METABOLIC PANEL
ALT: 16 U/L (ref 0–53)
AST: 13 U/L (ref 0–37)
Albumin: 4.5 g/dL (ref 3.5–5.2)
Alkaline Phosphatase: 71 U/L (ref 39–117)
BUN: 13 mg/dL (ref 6–23)
CO2: 30 mEq/L (ref 19–32)
Calcium: 9.8 mg/dL (ref 8.4–10.5)
Chloride: 100 mEq/L (ref 96–112)
Creatinine, Ser: 0.98 mg/dL (ref 0.40–1.50)
GFR: 86.4 mL/min (ref >60.00)
Glucose, Bld: 112 mg/dL — ABNORMAL HIGH (ref 70–99)
Potassium: 4.4 mEq/L (ref 3.5–5.1)
Sodium: 138 mEq/L (ref 135–145)
Total Bilirubin: 0.8 mg/dL (ref 0.2–1.2)
Total Protein: 6.9 g/dL (ref 6.0–8.3)

## 2022-03-03 LAB — CBC WITH DIFFERENTIAL/PLATELET
Basophils Absolute: 0.1 10*3/uL (ref 0.0–0.1)
Basophils Relative: 0.7 % (ref 0.0–3.0)
Eosinophils Absolute: 0.1 10*3/uL (ref 0.0–0.7)
Eosinophils Relative: 1.5 % (ref 0.0–5.0)
HCT: 46.6 % (ref 39.0–52.0)
Hemoglobin: 15.8 g/dL (ref 13.0–17.0)
Lymphocytes Relative: 20 % (ref 12.0–46.0)
Lymphs Abs: 1.5 10*3/uL (ref 0.7–4.0)
MCHC: 33.9 g/dL (ref 30.0–36.0)
MCV: 92.8 fl (ref 78.0–100.0)
Monocytes Absolute: 0.7 10*3/uL (ref 0.1–1.0)
Monocytes Relative: 9.6 % (ref 3.0–12.0)
Neutro Abs: 5.2 10*3/uL (ref 1.4–7.7)
Neutrophils Relative %: 68.2 % (ref 43.0–77.0)
Platelets: 282 10*3/uL (ref 150.0–400.0)
RBC: 5.02 Mil/uL (ref 4.22–5.81)
RDW: 13.2 % (ref 11.5–15.5)
WBC: 7.6 10*3/uL (ref 4.0–10.5)

## 2022-03-03 LAB — TSH: TSH: 2.42 u[IU]/mL (ref 0.35–5.50)

## 2022-03-14 ENCOUNTER — Other Ambulatory Visit: Payer: Self-pay | Admitting: Neurology

## 2022-03-30 ENCOUNTER — Other Ambulatory Visit: Payer: Self-pay | Admitting: Neurology

## 2022-03-31 NOTE — Telephone Encounter (Signed)
Last filled on 08/19/21 Follow up scheduled on 06/27/22 with Dr. Felecia Shelling

## 2022-05-02 DIAGNOSIS — F331 Major depressive disorder, recurrent, moderate: Secondary | ICD-10-CM | POA: Diagnosis not present

## 2022-05-02 DIAGNOSIS — F4312 Post-traumatic stress disorder, chronic: Secondary | ICD-10-CM | POA: Diagnosis not present

## 2022-05-02 DIAGNOSIS — F411 Generalized anxiety disorder: Secondary | ICD-10-CM | POA: Diagnosis not present

## 2022-05-16 ENCOUNTER — Other Ambulatory Visit: Payer: Self-pay | Admitting: Neurology

## 2022-05-31 ENCOUNTER — Encounter: Payer: Self-pay | Admitting: Neurology

## 2022-05-31 ENCOUNTER — Telehealth: Payer: Self-pay | Admitting: Neurology

## 2022-05-31 NOTE — Telephone Encounter (Signed)
LVM, sent mychart msg, sent cx letter informing pt of r/s for 4/29 appt- MD out.

## 2022-06-14 ENCOUNTER — Other Ambulatory Visit: Payer: Self-pay | Admitting: Neurology

## 2022-06-14 NOTE — Telephone Encounter (Signed)
Last seen on 08/19/21 per note "Continue ropinirole and doxepin." Follow up scheduled on 06/29/22 Last filled on 03/31/22 #90 tablets (90 day supply)

## 2022-06-27 ENCOUNTER — Ambulatory Visit: Payer: Medicare HMO | Admitting: Neurology

## 2022-06-29 ENCOUNTER — Ambulatory Visit: Payer: Medicare HMO | Admitting: Neurology

## 2022-07-06 ENCOUNTER — Other Ambulatory Visit: Payer: Self-pay | Admitting: Neurology

## 2022-07-06 NOTE — Telephone Encounter (Signed)
Last seen on 08/19/21 per note "Lamotrigine dose could also be increased if needed. " Follow up scheduled on 07/20/22  Last filled on 04/08/22 #180 tablets (90 day supply)

## 2022-07-18 ENCOUNTER — Ambulatory Visit: Payer: Medicare HMO | Admitting: Neurology

## 2022-07-18 ENCOUNTER — Encounter: Payer: Self-pay | Admitting: Neurology

## 2022-07-18 VITALS — BP 110/71 | HR 71 | Ht 69.0 in | Wt 223.5 lb

## 2022-07-18 DIAGNOSIS — F418 Other specified anxiety disorders: Secondary | ICD-10-CM | POA: Diagnosis not present

## 2022-07-18 DIAGNOSIS — Z79899 Other long term (current) drug therapy: Secondary | ICD-10-CM

## 2022-07-18 DIAGNOSIS — R208 Other disturbances of skin sensation: Secondary | ICD-10-CM | POA: Diagnosis not present

## 2022-07-18 DIAGNOSIS — G2581 Restless legs syndrome: Secondary | ICD-10-CM

## 2022-07-18 DIAGNOSIS — G4700 Insomnia, unspecified: Secondary | ICD-10-CM | POA: Diagnosis not present

## 2022-07-18 DIAGNOSIS — R252 Cramp and spasm: Secondary | ICD-10-CM

## 2022-07-18 DIAGNOSIS — R269 Unspecified abnormalities of gait and mobility: Secondary | ICD-10-CM | POA: Diagnosis not present

## 2022-07-18 DIAGNOSIS — G35 Multiple sclerosis: Secondary | ICD-10-CM

## 2022-07-18 MED ORDER — ROPINIROLE HCL 0.5 MG PO TABS: ORAL_TABLET | ORAL | 3 refills | Status: AC

## 2022-07-18 MED ORDER — LAMOTRIGINE 100 MG PO TABS: ORAL_TABLET | ORAL | 0 refills | Status: DC

## 2022-07-18 MED ORDER — LAMOTRIGINE 150 MG PO TABS: 150.0000 mg | ORAL_TABLET | Freq: Every day | ORAL | 3 refills | Status: DC

## 2022-07-18 NOTE — Patient Instructions (Signed)
Medication changes: I sent in a prescription for a higher dose of lamotrigine (150 mg twice a day instead of 100 mg twice a day) Tizanidine can be increased from twice a day to 3 times a day.  If he tolerates that it can then be increased to 4 times a day (1 with each meal and 1 at bedtime) Ropinirole will be increased from 3 times a day to 1 in the morning, 1 later in the day and 2 at bedtime 4.   Try to see if the doxepin can be stopped.
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
## 2022-07-19 LAB — CBC WITH DIFFERENTIAL/PLATELET
Basophils Absolute: 0 10*3/uL (ref 0.0–0.2)
Basos: 0 %
EOS (ABSOLUTE): 0.1 10*3/uL (ref 0.0–0.4)
Eos: 1 %
Hematocrit: 45.9 % (ref 37.5–51.0)
Hemoglobin: 15.6 g/dL (ref 13.0–17.7)
Immature Grans (Abs): 0 10*3/uL (ref 0.0–0.1)
Immature Granulocytes: 0 %
Lymphocytes Absolute: 1.5 10*3/uL (ref 0.7–3.1)
Lymphs: 14 %
MCH: 31 pg (ref 26.6–33.0)
MCHC: 34 g/dL (ref 31.5–35.7)
MCV: 91 fL (ref 79–97)
Monocytes Absolute: 0.9 10*3/uL (ref 0.1–0.9)
Monocytes: 8 %
Neutrophils Absolute: 8.4 10*3/uL — ABNORMAL HIGH (ref 1.4–7.0)
Neutrophils: 77 %
Platelets: 261 10*3/uL (ref 150–450)
RBC: 5.03 x10E6/uL (ref 4.14–5.80)
RDW: 12.8 % (ref 11.6–15.4)
WBC: 11 10*3/uL — ABNORMAL HIGH (ref 3.4–10.8)

## 2022-07-19 LAB — IGG, IGA, IGM
IgA/Immunoglobulin A, Serum: 192 mg/dL (ref 90–386)
IgG (Immunoglobin G), Serum: 951 mg/dL (ref 603–1613)
IgM (Immunoglobulin M), Srm: 54 mg/dL (ref 20–172)

## 2022-07-29 DIAGNOSIS — F411 Generalized anxiety disorder: Secondary | ICD-10-CM | POA: Diagnosis not present

## 2022-07-29 DIAGNOSIS — F331 Major depressive disorder, recurrent, moderate: Secondary | ICD-10-CM | POA: Diagnosis not present

## 2022-07-29 DIAGNOSIS — F4312 Post-traumatic stress disorder, chronic: Secondary | ICD-10-CM | POA: Diagnosis not present

## 2022-09-05 ENCOUNTER — Other Ambulatory Visit: Payer: Self-pay | Admitting: Neurology

## 2022-09-05 ENCOUNTER — Other Ambulatory Visit: Payer: Self-pay | Admitting: Family Medicine

## 2022-09-05 DIAGNOSIS — K219 Gastro-esophageal reflux disease without esophagitis: Secondary | ICD-10-CM

## 2022-09-06 NOTE — Telephone Encounter (Signed)
Last seen on 07/18/22 per note "Increase tizanidine to 4 mg, 3 or 4 times a day. " Follow up scheduled 02/06/23 Last filled on 06/22/22 #360 tablets (90 day supply)

## 2022-09-12 ENCOUNTER — Telehealth: Payer: Self-pay | Admitting: Neurology

## 2022-09-12 DIAGNOSIS — G35 Multiple sclerosis: Secondary | ICD-10-CM

## 2022-09-12 MED ORDER — KESIMPTA 20 MG/0.4ML ~~LOC~~ SOAJ
20.0000 mg | SUBCUTANEOUS | 5 refills | Status: AC
Start: 2022-09-12 — End: ?

## 2022-09-12 NOTE — Telephone Encounter (Signed)
Pt requesting a refill on KESIMPTA 20 MG/0.4ML SOAJ. Should be sent to [facility]

## 2022-09-12 NOTE — Telephone Encounter (Signed)
Last seen on 07/18/22 per note "Continue Kesimpta. " Follow  up scheduled on 02/06/23  Rx sent

## 2022-10-05 ENCOUNTER — Other Ambulatory Visit: Payer: Self-pay | Admitting: Family Medicine

## 2022-10-05 DIAGNOSIS — I10 Essential (primary) hypertension: Secondary | ICD-10-CM

## 2022-10-19 ENCOUNTER — Other Ambulatory Visit: Payer: Self-pay | Admitting: Neurology

## 2022-10-19 NOTE — Telephone Encounter (Signed)
Last seen on 07/18/22 Follow up scheduled on 02/06/23 Last filled on 08/04/22 08/04/22 #180 tablets (90 day supply)

## 2022-11-02 ENCOUNTER — Other Ambulatory Visit: Payer: Self-pay | Admitting: Neurology

## 2022-11-02 NOTE — Telephone Encounter (Signed)
Last seen on 07/18/22 Follow up scheduled on 02/03/23

## 2022-11-11 ENCOUNTER — Encounter: Payer: Self-pay | Admitting: Family Medicine

## 2022-11-11 ENCOUNTER — Ambulatory Visit (INDEPENDENT_AMBULATORY_CARE_PROVIDER_SITE_OTHER): Payer: Medicare HMO | Admitting: Family Medicine

## 2022-11-11 VITALS — BP 93/54 | HR 71 | Ht 69.0 in | Wt 215.0 lb

## 2022-11-11 DIAGNOSIS — G35 Multiple sclerosis: Secondary | ICD-10-CM

## 2022-11-11 DIAGNOSIS — K219 Gastro-esophageal reflux disease without esophagitis: Secondary | ICD-10-CM | POA: Diagnosis not present

## 2022-11-11 DIAGNOSIS — Z720 Tobacco use: Secondary | ICD-10-CM | POA: Diagnosis not present

## 2022-11-11 DIAGNOSIS — I10 Essential (primary) hypertension: Secondary | ICD-10-CM | POA: Diagnosis not present

## 2022-11-11 DIAGNOSIS — F418 Other specified anxiety disorders: Secondary | ICD-10-CM

## 2022-11-11 LAB — CBC WITH DIFFERENTIAL/PLATELET
Basophils Absolute: 0 10*3/uL (ref 0.0–0.1)
Basophils Relative: 0.4 % (ref 0.0–3.0)
Eosinophils Absolute: 0.1 10*3/uL (ref 0.0–0.7)
Eosinophils Relative: 1.3 % (ref 0.0–5.0)
HCT: 45.8 % (ref 39.0–52.0)
Hemoglobin: 15 g/dL (ref 13.0–17.0)
Lymphocytes Relative: 16.4 % (ref 12.0–46.0)
Lymphs Abs: 1.3 10*3/uL (ref 0.7–4.0)
MCHC: 32.8 g/dL (ref 30.0–36.0)
MCV: 94 fl (ref 78.0–100.0)
Monocytes Absolute: 0.8 10*3/uL (ref 0.1–1.0)
Monocytes Relative: 9.7 % (ref 3.0–12.0)
Neutro Abs: 5.8 10*3/uL (ref 1.4–7.7)
Neutrophils Relative %: 72.2 % (ref 43.0–77.0)
Platelets: 258 10*3/uL (ref 150.0–400.0)
RBC: 4.88 Mil/uL (ref 4.22–5.81)
RDW: 13.5 % (ref 11.5–15.5)
WBC: 8 10*3/uL (ref 4.0–10.5)

## 2022-11-11 LAB — COMPREHENSIVE METABOLIC PANEL
ALT: 34 U/L (ref 0–53)
AST: 22 U/L (ref 0–37)
Albumin: 4.3 g/dL (ref 3.5–5.2)
Alkaline Phosphatase: 69 U/L (ref 39–117)
BUN: 23 mg/dL (ref 6–23)
CO2: 25 meq/L (ref 19–32)
Calcium: 9.6 mg/dL (ref 8.4–10.5)
Chloride: 106 meq/L (ref 96–112)
Creatinine, Ser: 0.85 mg/dL (ref 0.40–1.50)
GFR: 96.89 mL/min (ref >60.00)
Glucose, Bld: 97 mg/dL (ref 70–99)
Potassium: 4.5 meq/L (ref 3.5–5.1)
Sodium: 138 meq/L (ref 135–145)
Total Bilirubin: 0.6 mg/dL (ref 0.2–1.2)
Total Protein: 6.6 g/dL (ref 6.0–8.3)

## 2022-11-11 LAB — LIPID PANEL
Cholesterol: 101 mg/dL (ref 0–200)
HDL: 37.5 mg/dL — ABNORMAL LOW (ref >39.00)
LDL Cholesterol: 46 mg/dL (ref 0–99)
NonHDL: 63.14
Total CHOL/HDL Ratio: 3
Triglycerides: 85 mg/dL (ref 0.0–149.0)
VLDL: 17 mg/dL (ref 0.0–40.0)

## 2022-11-11 LAB — TSH: TSH: 3.43 u[IU]/mL (ref 0.35–5.50)

## 2022-11-11 NOTE — Assessment & Plan Note (Signed)
Stable. No acute concerns Continue following with neurology.  Continue current regimen: Kesimpta monthly injections, risperidone 0.5 mg at bedtime, PRN, Requip 0.5 mg TID, PRN Zanaflex

## 2022-11-11 NOTE — Assessment & Plan Note (Signed)
Stable on current regimen. No SI/HI.

## 2022-11-11 NOTE — Assessment & Plan Note (Signed)
Working on reducing smoking. Down to 0.5 pack/day. Aware of risks.

## 2022-11-11 NOTE — Patient Instructions (Signed)
Stop the Prazosin for now. Monitor BP at home. If BP <110/80 skip your amlodipine-olmesartan and recheck BP later in the day.  Follow-up in 3 months to see how we are doing, or sooner if needed.

## 2022-11-11 NOTE — Assessment & Plan Note (Signed)
Stop the Prazosin for now (patient is unsure of why he is taking this or who has been prescribing it for him) Monitor BP at home. If BP <110/80 skip your amlodipine-olmesartan and recheck BP later in the day. If borderline BP, you can half your pill.  Follow-up in 3 months to see how we are doing, or sooner if needed.

## 2022-11-11 NOTE — Assessment & Plan Note (Signed)
Controlled with Prilosec and lifestyle measures. He is going to try to stop taking the Prilosec regularly and see how he does now that he has made significant lifestyle changes

## 2022-11-11 NOTE — Progress Notes (Signed)
Established Patient Office Visit  Subjective   Patient ID: Seth Lindsey, male    DOB: January 24, 1966  Age: 57 y.o. MRN: [id number]  Chief Complaint  Patient presents with   Medical Management of Chronic Issues    HPI  Patient is here for routine follow-up. He is here with his wife today. Significant lifestyle changes 3 weeks ago - feeling significantly better, but has noticed his BP is starting to drop. He has been feeling a little lightheaded with position changes at times.    Hypertension: - Medications: amlodipine-olmesartan 5-20 mg daily  - Compliance: good - Checking BP at home: no - Denies any SOB, recurrent headaches, CP, vision changes, LE edema, dizziness, palpitations, or medication side effects. - Diet: healthy, mostly whole-30 type diet, organic - Exercise: >10k/day - He is still smoking up to a half pack per day of cigarettes.  BP Readings from Last 3 Encounters:  11/11/22 (!) 93/54  07/18/22 110/71  03/02/22 138/83    GERD: - management: omeprazole 20 mg daily - symptoms: none   Mood follow-up: - Diagnosis: anxiety/depression - Treatment: Lamictal 150 mg daily (unsure if he is still taking). Zoloft 100 mg daily,  - Medication side effects: none - SI/HI: no - Update: Doing great   Multiple Sclerosis: - Treatment: Kesimpta monthly injections, risperidone 0.5 mg at bedtime, PRN, Requip 0.5 mg TID, PRN Zanaflex - He is following with Neurology, Dr. Epimenio Foot. Following up in December.  - States MS was pretty bad last winter, so they have been motivated to be more active this fall. He has been feeling much better since getting more active.        ROS All review of systems negative except what is listed in the HPI    Objective:     BP (!) 93/54   Pulse 71   Ht 5\' 9"  (1.753 m)   Wt 215 lb (97.5 kg)   SpO2 93%   BMI 31.75 kg/m    Physical Exam Vitals reviewed.  Constitutional:      Appearance: Normal appearance.  Cardiovascular:     Rate and  Rhythm: Normal rate and regular rhythm.  Pulmonary:     Effort: Pulmonary effort is normal.     Breath sounds: Normal breath sounds.  Neurological:     Mental Status: He is alert. Mental status is at baseline.     Motor: Weakness present.     Gait: Gait abnormal.     Comments: Using cane  Psychiatric:        Mood and Affect: Mood normal.        Behavior: Behavior normal.        Thought Content: Thought content normal.        Judgment: Judgment normal.      No results found for any visits on 11/11/22.    The 10-year ASCVD risk score (Arnett DK, et al., 2019) is: 6.4%    Assessment & Plan:   Problem List Items Addressed This Visit       Active Problems   Multiple sclerosis (HCC) (Chronic)    Stable. No acute concerns Continue following with neurology.  Continue current regimen: Kesimpta monthly injections, risperidone 0.5 mg at bedtime, PRN, Requip 0.5 mg TID, PRN Zanaflex      Depression with anxiety (Chronic)    Stable on current regimen. No SI/HI.      Primary hypertension - Primary (Chronic)    Stop the Prazosin for now (patient is unsure of why he  is taking this or who has been prescribing it for him) Monitor BP at home. If BP <110/80 skip your amlodipine-olmesartan and recheck BP later in the day. If borderline BP, you can half your pill.  Follow-up in 3 months to see how we are doing, or sooner if needed.       Relevant Orders   TSH   Lipid panel   Comprehensive metabolic panel   CBC with Differential/Platelet   Gastroesophageal reflux disease (Chronic)    Controlled with Prilosec and lifestyle measures. He is going to try to stop taking the Prilosec regularly and see how he does now that he has made significant lifestyle changes      Tobacco abuse (Chronic)    Working on reducing smoking. Down to 0.5 pack/day. Aware of risks.        Return in about 3 months (around 02/10/2023) for routine follow-up.    Clayborne Dana, NP

## 2022-12-19 DIAGNOSIS — F4312 Post-traumatic stress disorder, chronic: Secondary | ICD-10-CM | POA: Diagnosis not present

## 2022-12-19 DIAGNOSIS — F411 Generalized anxiety disorder: Secondary | ICD-10-CM | POA: Diagnosis not present

## 2022-12-19 DIAGNOSIS — F331 Major depressive disorder, recurrent, moderate: Secondary | ICD-10-CM | POA: Diagnosis not present

## 2023-01-19 ENCOUNTER — Other Ambulatory Visit: Payer: Self-pay | Admitting: Neurology

## 2023-01-19 ENCOUNTER — Other Ambulatory Visit: Payer: Self-pay

## 2023-01-24 ENCOUNTER — Telehealth: Payer: Self-pay | Admitting: Neurology

## 2023-01-24 NOTE — Telephone Encounter (Signed)
[place] app[place]

## 2023-02-06 ENCOUNTER — Ambulatory Visit: Payer: Medicare HMO | Admitting: Neurology

## 2023-02-10 ENCOUNTER — Ambulatory Visit: Payer: Medicare HMO | Admitting: Family Medicine

## 2023-10-03 ENCOUNTER — Other Ambulatory Visit: Payer: Self-pay | Admitting: Neurology

## 2023-10-04 NOTE — Telephone Encounter (Signed)
 Last seen on 07/18/22 No follow up scheduled
